# Patient Record
Sex: Female | Born: 1960
Health system: Southern US, Community
[De-identification: ages and names within clinical notes are randomized; demographics above are authoritative.]

## PROBLEM LIST (undated history)

## (undated) DIAGNOSIS — F419 Anxiety disorder, unspecified: Secondary | ICD-10-CM

## (undated) DIAGNOSIS — E785 Hyperlipidemia, unspecified: Secondary | ICD-10-CM

## (undated) DIAGNOSIS — J45909 Unspecified asthma, uncomplicated: Secondary | ICD-10-CM

## (undated) DIAGNOSIS — C801 Malignant (primary) neoplasm, unspecified: Secondary | ICD-10-CM

## (undated) DIAGNOSIS — Z923 Personal history of irradiation: Secondary | ICD-10-CM

## (undated) DIAGNOSIS — C50919 Malignant neoplasm of unspecified site of unspecified female breast: Secondary | ICD-10-CM

## (undated) HISTORY — DX: Malignant neoplasm of unspecified site of unspecified female breast: C50.919

## (undated) HISTORY — PX: BREAST BIOPSY: SHX20

## (undated) HISTORY — PX: BREAST SURGERY: SHX581

## (undated) HISTORY — DX: Hyperlipidemia, unspecified: E78.5

## (undated) HISTORY — PX: HYSTERECTOMY: SHX81

## (undated) HISTORY — DX: Unspecified asthma, uncomplicated: J45.909

## (undated) HISTORY — DX: Anxiety disorder, unspecified: F41.9

---
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
## 2010-06-04 HISTORY — PX: COLONOSCOPY: SHX174

## 2014-06-04 DIAGNOSIS — C801 Malignant (primary) neoplasm, unspecified: Secondary | ICD-10-CM

## 2014-06-04 HISTORY — DX: Malignant (primary) neoplasm, unspecified: C80.1

## 2014-06-04 HISTORY — PX: BREAST LUMPECTOMY: SHX2

## 2016-06-04 HISTORY — PX: ABDOMINAL HYSTERECTOMY: SHX81

## 2019-01-07 ENCOUNTER — Ambulatory Visit (INDEPENDENT_AMBULATORY_CARE_PROVIDER_SITE_OTHER): Payer: BC Managed Care – PPO | Admitting: Family Medicine

## 2019-01-07 ENCOUNTER — Telehealth: Payer: Self-pay | Admitting: Family Medicine

## 2019-01-07 ENCOUNTER — Other Ambulatory Visit: Payer: Self-pay | Admitting: Family Medicine

## 2019-01-07 ENCOUNTER — Encounter: Payer: Self-pay | Admitting: Family Medicine

## 2019-01-07 ENCOUNTER — Other Ambulatory Visit: Payer: Self-pay

## 2019-01-07 ENCOUNTER — Ambulatory Visit: Payer: Self-pay | Admitting: Family Medicine

## 2019-01-07 VITALS — BP 110/82 | HR 74 | Temp 98.6°F | Ht 65.0 in | Wt 205.5 lb

## 2019-01-07 DIAGNOSIS — Z853 Personal history of malignant neoplasm of breast: Secondary | ICD-10-CM

## 2019-01-07 DIAGNOSIS — E785 Hyperlipidemia, unspecified: Secondary | ICD-10-CM

## 2019-01-07 DIAGNOSIS — F418 Other specified anxiety disorders: Secondary | ICD-10-CM | POA: Diagnosis not present

## 2019-01-07 DIAGNOSIS — Z0000 Encounter for general adult medical examination without abnormal findings: Secondary | ICD-10-CM | POA: Diagnosis not present

## 2019-01-07 DIAGNOSIS — Z1283 Encounter for screening for malignant neoplasm of skin: Secondary | ICD-10-CM

## 2019-01-07 DIAGNOSIS — E669 Obesity, unspecified: Secondary | ICD-10-CM

## 2019-01-07 LAB — CBC
HCT: 40.1 % (ref 36.0–46.0)
Hemoglobin: 13.3 g/dL (ref 12.0–15.0)
MCHC: 33.1 g/dL (ref 30.0–36.0)
MCV: 94.6 fl (ref 78.0–100.0)
Platelets: 254 10*3/uL (ref 150.0–400.0)
RBC: 4.24 Mil/uL (ref 3.87–5.11)
RDW: 13.3 % (ref 11.5–15.5)
WBC: 4.9 10*3/uL (ref 4.0–10.5)

## 2019-01-07 LAB — COMPREHENSIVE METABOLIC PANEL
ALT: 26 U/L (ref 0–35)
AST: 22 U/L (ref 0–37)
Albumin: 4.8 g/dL (ref 3.5–5.2)
Alkaline Phosphatase: 73 U/L (ref 39–117)
BUN: 19 mg/dL (ref 6–23)
CO2: 29 mEq/L (ref 19–32)
Calcium: 9.7 mg/dL (ref 8.4–10.5)
Chloride: 103 mEq/L (ref 96–112)
Creatinine, Ser: 0.72 mg/dL (ref 0.40–1.20)
GFR: 82.82 mL/min (ref >60.00)
Glucose, Bld: 103 mg/dL — ABNORMAL HIGH (ref 70–99)
Potassium: 4.7 mEq/L (ref 3.5–5.1)
Sodium: 140 mEq/L (ref 135–145)
Total Bilirubin: 0.9 mg/dL (ref 0.2–1.2)
Total Protein: 7.4 g/dL (ref 6.0–8.3)

## 2019-01-07 LAB — LIPID PANEL
Cholesterol: 248 mg/dL — ABNORMAL HIGH (ref 0–200)
HDL: 79.3 mg/dL (ref >39.00)
LDL Cholesterol: 132 mg/dL — ABNORMAL HIGH (ref 0–99)
NonHDL: 169.02
Total CHOL/HDL Ratio: 3
Triglycerides: 186 mg/dL — ABNORMAL HIGH (ref 0.0–149.0)
VLDL: 37.2 mg/dL (ref 0.0–40.0)

## 2019-01-07 MED ORDER — TRAZODONE HCL 50 MG PO TABS: 25.0000 mg | ORAL_TABLET | Freq: Every evening | ORAL | 3 refills | Status: DC | PRN

## 2019-01-07 MED ORDER — ATORVASTATIN CALCIUM 40 MG PO TABS: 40.0000 mg | ORAL_TABLET | Freq: Every day | ORAL | 2 refills | Status: DC

## 2019-01-07 MED ORDER — ANASTROZOLE 1 MG PO TABS: 1.0000 mg | ORAL_TABLET | Freq: Every day | ORAL | 0 refills | Status: DC

## 2019-01-07 MED ORDER — BUPROPION HCL ER (SMOKING DET) 150 MG PO TB12: 150.0000 mg | ORAL_TABLET | Freq: Two times a day (BID) | ORAL | 2 refills | Status: DC

## 2019-01-07 MED ORDER — CLONAZEPAM 1 MG PO TABS: 0.5000 mg | ORAL_TABLET | ORAL | 2 refills | Status: DC | PRN

## 2019-01-07 NOTE — Telephone Encounter (Signed)
Referral done

## 2019-01-07 NOTE — Progress Notes (Signed)
Chief Complaint  Patient presents with  . New Patient (Initial Visit)       New Patient Visit SUBJECTIVE: HPI: Veronica Lloyd is an 58 y.o.female who is being seen for establishing care.  The patient was previously seen in [place].  Anxiety Patient has a history of anxiety.  She is currently on bupropion 150 mg twice daily.  She is also on Klonopin as needed in addition to [place] at night as needed.  She does not follow with a counselor or psychologist.  She has less responsibility as a Copy.  This seeds her anxiety.  She is not having any adverse reactions with the above.  Patient has a history of breast cancer.  She saw an oncologist in [place].  She is currently on anastrozole.  She needs a refill until she can get in with a specialist.  She has a history of hyperlipidemia for which he takes atorvastatin 40 mg daily.  Diet could be better, she tries to stay active with exercise.  She is not havingany side effects of the medicine.  No known history of coronary artery disease.  No Known Allergies  Past Medical History:  Diagnosis Date  . Anxiety   . Cancer ([place]) 2016   Breast  . Hyperlipidemia    Past Surgical History:  Procedure Laterality Date  . ABDOMINAL HYSTERECTOMY  2018   complete  . BREAST LUMPECTOMY Right 2016   Family History  Problem Relation Age of Onset  . Asthma Mother   . Diabetes Father        type 2  . Heart disease Father   . Cancer Brother        prostate   No Known Allergies  Current Outpatient Medications:  .  anastrozole (ARIMIDEX) 1 MG tablet, Take 1 tablet (1 mg total) by mouth daily., Disp: 30 tablet, Rfl: 0 .  atorvastatin (LIPITOR) 40 MG tablet, Take 1 tablet (40 mg total) by mouth daily., Disp: 90 tablet, Rfl: 2 .  buPROPion (ZYBAN) 150 MG 12 hr tablet, Take 1 tablet (150 mg total) by mouth 2 (two) times daily., Disp: 180 tablet, Rfl: 2 .  clonazePAM (KLONOPIN) 1 MG tablet, Take 0.5-1 tablets (0.5-1 mg total) by mouth as  needed for anxiety., Disp: 30 tablet, Rfl: 2 .  traZODone (DESYREL) 50 MG tablet, Take 0.5-1 tablets (25-50 mg total) by mouth at bedtime as needed for sleep., Disp: 30 tablet, Rfl: 3  ROS Cardiovascular: Denies chest pain  Respiratory: Denies dyspnea   OBJECTIVE: BP 110/82 (BP Location: Left Arm, Patient Position: Sitting, Cuff Size: Large)   Pulse 74   Temp 98.6 F (37 C) (Oral)   Ht 5\' 5"  (1.651 m)   Wt 205 lb 8 oz (93.2 kg)   SpO2 98%   BMI 34.20 kg/m   Constitutional: -  VS reviewed -  Well developed, well nourished, appears stated age -  No apparent distress  Psychiatric: -  Oriented to person, place, and time -  Memory intact -  Affect and mood normal -  Fluent conversation, good eye contact -  Judgment and insight age appropriate  Eye: -  Conjunctivae clear, no discharge -  Pupils symmetric, round, reactive to light  ENMT: -  MMM    Pharynx moist, no exudate, no erythema  Cardiovascular: -  RRR -  No LE edema -  No bruits  Respiratory: -  Normal respiratory effort, no accessory muscle use, no retraction -  Breath sounds equal, no wheezes,  no ronchi, no crackles  Musculoskeletal: -  No clubbing, no cyanosis -  Gait normal  Skin: -  No significant lesion on inspection -  Warm and dry to palpation   ASSESSMENT/PLAN: History of breast cancer - Plan: Ambulatory referral to Oncology, refill Arimidex in meanwhile  Situational anxiety - cont buspirone, klonopin prn, change Lunesta to trazodone, [facility] info given  Hyperlipidemia, unspecified hyperlipidemia type - Plan: cont statin  Well adult exam - Plan: CBC, Comprehensive metabolic panel, Lipid panel  Patient instructed to sign release of records form from her previous PCP.  Patient should return in 1 mo for CPE. The patient voiced understanding and agreement to the plan.   [place], DO 01/07/19  11:51 AM

## 2019-01-07 NOTE — Patient Instructions (Signed)
Please consider counseling. Contact [phone] to schedule an appointment or inquire about cost/insurance coverage.  Coping skills Choose 5 that work for you:  Take a deep breath  Count to 20  Read a book  Do a puzzle  Meditate  Bake  Sing  Knit  Garden  Pray  Go outside  Call a friend  Listen to music  Take a walk  Color  Send a note  Take a bath  Watch a movie  Be alone in a quiet place  Pet an animal  Visit a friend  Journal  Exercise  Stretch   If you do not hear anything about your referral in the next 1-2 weeks, call our office and ask for an update.  Let us know if you need anything.

## 2019-01-07 NOTE — Progress Notes (Signed)
abu

## 2019-01-07 NOTE — Telephone Encounter (Signed)
OK to refer, skin cancer screening is dx. TY.

## 2019-01-09 ENCOUNTER — Telehealth: Payer: Self-pay | Admitting: Hematology

## 2019-01-09 NOTE — Telephone Encounter (Signed)
lmom to inform patient of new patient appt 8/20 at 0830. Advised patient to request previous medical records form [place] and fax to our office [phone] attn Roselyn Reef

## 2019-01-22 ENCOUNTER — Other Ambulatory Visit: Payer: Self-pay | Admitting: Hematology

## 2019-01-22 ENCOUNTER — Inpatient Hospital Stay: Payer: BC Managed Care – PPO | Attending: Hematology | Admitting: Hematology

## 2019-01-22 ENCOUNTER — Encounter: Payer: Self-pay | Admitting: Hematology

## 2019-01-22 ENCOUNTER — Inpatient Hospital Stay: Payer: BC Managed Care – PPO

## 2019-01-22 ENCOUNTER — Other Ambulatory Visit: Payer: Self-pay

## 2019-01-22 VITALS — BP 118/79 | HR 74 | Temp 97.6°F | Resp 19 | Ht 65.0 in | Wt 211.0 lb

## 2019-01-22 DIAGNOSIS — E785 Hyperlipidemia, unspecified: Secondary | ICD-10-CM | POA: Diagnosis not present

## 2019-01-22 DIAGNOSIS — Z90722 Acquired absence of ovaries, bilateral: Secondary | ICD-10-CM | POA: Diagnosis not present

## 2019-01-22 DIAGNOSIS — C50411 Malignant neoplasm of upper-outer quadrant of right female breast: Secondary | ICD-10-CM | POA: Insufficient documentation

## 2019-01-22 DIAGNOSIS — M25571 Pain in right ankle and joints of right foot: Secondary | ICD-10-CM | POA: Diagnosis not present

## 2019-01-22 DIAGNOSIS — M25572 Pain in left ankle and joints of left foot: Secondary | ICD-10-CM | POA: Diagnosis not present

## 2019-01-22 DIAGNOSIS — C50919 Malignant neoplasm of unspecified site of unspecified female breast: Secondary | ICD-10-CM

## 2019-01-22 DIAGNOSIS — Z923 Personal history of irradiation: Secondary | ICD-10-CM | POA: Diagnosis not present

## 2019-01-22 DIAGNOSIS — Z170 Estrogen receptor positive status [ER+]: Secondary | ICD-10-CM | POA: Diagnosis not present

## 2019-01-22 DIAGNOSIS — Z90710 Acquired absence of both cervix and uterus: Secondary | ICD-10-CM | POA: Diagnosis not present

## 2019-01-22 DIAGNOSIS — F419 Anxiety disorder, unspecified: Secondary | ICD-10-CM | POA: Diagnosis not present

## 2019-01-22 DIAGNOSIS — Z79811 Long term (current) use of aromatase inhibitors: Secondary | ICD-10-CM | POA: Diagnosis not present

## 2019-01-22 DIAGNOSIS — Z79899 Other long term (current) drug therapy: Secondary | ICD-10-CM | POA: Diagnosis not present

## 2019-01-22 LAB — CBC WITH DIFFERENTIAL (CANCER CENTER ONLY)
Abs Immature Granulocytes: 0.01 10*3/uL (ref 0.00–0.07)
Basophils Absolute: 0 10*3/uL (ref 0.0–0.1)
Basophils Relative: 1 %
Eosinophils Absolute: 0.2 10*3/uL (ref 0.0–0.5)
Eosinophils Relative: 3 %
HCT: 40.1 % (ref 36.0–46.0)
Hemoglobin: 12.9 g/dL (ref 12.0–15.0)
Immature Granulocytes: 0 %
Lymphocytes Relative: 28 %
Lymphs Abs: 1.5 10*3/uL (ref 0.7–4.0)
MCH: 30.8 pg (ref 26.0–34.0)
MCHC: 32.2 g/dL (ref 30.0–36.0)
MCV: 95.7 fL (ref 80.0–100.0)
Monocytes Absolute: 0.6 10*3/uL (ref 0.1–1.0)
Monocytes Relative: 12 %
Neutro Abs: 3 10*3/uL (ref 1.7–7.7)
Neutrophils Relative %: 56 %
Platelet Count: 247 10*3/uL (ref 150–400)
RBC: 4.19 MIL/uL (ref 3.87–5.11)
RDW: 13.1 % (ref 11.5–15.5)
WBC Count: 5.3 10*3/uL (ref 4.0–10.5)
nRBC: 0 % (ref 0.0–0.2)

## 2019-01-22 LAB — CMP (CANCER CENTER ONLY)
ALT: 26 U/L (ref 0–44)
AST: 20 U/L (ref 15–41)
Albumin: 4.3 g/dL (ref 3.5–5.0)
Alkaline Phosphatase: 66 U/L (ref 38–126)
Anion gap: 6 (ref 5–15)
BUN: 12 mg/dL (ref 6–20)
CO2: 28 mmol/L (ref 22–32)
Calcium: 9.1 mg/dL (ref 8.9–10.3)
Chloride: 104 mmol/L (ref 98–111)
Creatinine: 0.8 mg/dL (ref 0.44–1.00)
GFR, Est AFR Am: >60 mL/min (ref >60)
GFR, Estimated: >60 mL/min (ref >60)
Glucose, Bld: 94 mg/dL (ref 70–99)
Potassium: 4.3 mmol/L (ref 3.5–5.1)
Sodium: 138 mmol/L (ref 135–145)
Total Bilirubin: 0.6 mg/dL (ref 0.3–1.2)
Total Protein: 6.5 g/dL (ref 6.5–8.1)

## 2019-01-22 MED ORDER — ANASTROZOLE 1 MG PO TABS: 1.0000 mg | ORAL_TABLET | Freq: Every day | ORAL | 3 refills | Status: AC

## 2019-01-22 NOTE — Progress Notes (Signed)
[place] NOTE  Patient Care Team: [name], DO as PCP - General (Family Medicine)  HEME/ONC OVERVIEW: 1. Stage I (pT1bpN0Mx) ILC of the right breast, ER/PR+, HER2- -03/2015: lumpectomy w/ SLN bx; path grade 1 ILC, 38m tumor, ER 86%, PR 48%, HER2- (2+ IHC, FISH non-amplified), margins negative, 0/1 SLN +  Adjuvant RT completed in 05/2015  Tamoxifene 06/2015 - 08/2016  TAH/BSO in 07/2016 -08/2016 - present: Arimidex   2. Post-menopause -TAH/BSO in 07/2016 -10/2016: bone density normal on DEXA   TREATMENT REGIMEN:  03/15/2015: lumpectomy w/ SLN bx   04/2015 - 06/03/2015: adjuvant RT   06/2015 - 08/2016: tamoxifene   08/2016 - present: Arimidex   ASSESSMENT & PLAN:   Stage I (pT1bpN0Mx) ILC of the right breast, ER/PR+, HER2- -I reviewed the patient records in detail, including external oncology clinic notes, lab studies, imaging results and pathology reports -In summary, patient was found with an abnormal mammogram in 01/2015, and ultrasound showed a 7 mm mass in the UOQ of the right breast.  Biopsy showe grade 1 ILC, ER 86%, PR 38%,  HER2- (2+ IHC, FISH non-amplified).  Bilateral breast MRI showed no additional findings.  She underwent lumpectomy on 03/15/2015, which showed grade 1 ILC, 765mtumor, ER 86%, PR 48%, HER2- (2+ IHC, FISH non-amplified), margins negative, 0/1 SLN +.  She received adjuvant radiation, followed by tamoxifen for approximately 1 year.  She was transitioned to Arimidex in 08/2016 after undergoing TAH/BSO. -I reviewed the records in detail with the patient -I also discussed with patient in detail the NCCN guideline  -In light of the early stage breast cancer, she will need q6m27monthlinical exam for 5 years, and then annually; in addition, she will need yearly surveillance MMG (next due in 11/2019) -We also discussed the duration of adjuvant AI. ASCO currently recommend a maximum of 5 years of limiting his inhibitor  therapy for postmenopausal women.  In a phase III study with letrozole, treatment with additional 5 years of AI (for total of 10 years of therapy) demonstrated significantly improved grade of disease-free survival and the decreased risk of disease recurrence and contralateral breast cancer, but overall survival was not significantly different, and bone-related adverse events occurred more frequently with letrozole versus placebo. -Patient expressed understanding and will think about it some more -I have refilled her anastrozole x 1 year today  -I also encouraged patient to take calcium-vitamin D supplement for bone health  Orders Placed This Encounter  Procedures  . MM Digital Diagnostic Bilat    Standing Status:   Future    Standing Expiration Date:   01/22/2020    Order Specific Question:   Reason for Exam (SYMPTOM  OR DIAGNOSIS REQUIRED)    Answer:   Hx of Stage I ILC of the right breast    Order Specific Question:   Is the patient pregnant?    Answer:   No    Order Specific Question:   [facility]    [facility] minutes were spent face-to-face with the patient during this encounter and over half of that time was spent on counseling and coordination of care as outlined above.    All questions were answered. The patient knows to call the clinic with any problems, questions or concerns.  Return in 6 months for clinic appt only. Labs every other visit.   YanTish MenD 01/22/2019 9:50 AM   CHIEF  COMPLAINTS/PURPOSE OF CONSULTATION:  "I am doing fine"  HISTORY OF PRESENTING ILLNESS:  Ceirra Belli 58 y.o. female is here because of history of Stage I ILC of the right breast.  Patient was found with an abnormal mammogram in 01/2015, and ultrasound showed a 7 mm mass in the UOQ of the right breast.  Biopsy showe grade 1 ILC, ER 86%, PR 38%,  HER2- (2+ IHC, FISH non-amplified).  Bilateral breast MRI showed no additional findings.  She  underwent lumpectomy on 03/15/2015, which showed grade 1 ILC, 32m tumor, ER 86%, PR 48%, HER2- (2+ IHC, FISH non-amplified), margins negative, 0/1 SLN +.  She received adjuvant radiation, followed by tamoxifen for approximately 1 year.  She was transitioned to Arimidex in 08/2016 after undergoing TAH/BSO.  Patient reports that she is tolerating anastrozole well without any significant side effects, such as arthralgia or myalgia.  She has occasional pain in her ankles, which she attributes to her obesity.  She has been trying to lose some weight.  [place]  She feels well today, and denies any complaint.  [facility]  I have reviewed her chart and materials related to her cancer extensively and collaborated history with the patient. Summary of oncologic history is as follows: Oncology History  Breast cancer ([facility]  03/15/2015 Cancer Staging   Staging form: Breast, AJCC 8th Edition - Pathologic stage from 03/15/2015: Stage IA (pT1b, pN0, cM0, G1, ER+, PR+, HER2-) - Signed by ZTish Men MD on 01/22/2019   01/22/2019 Initial Diagnosis   Breast cancer ([facility]     MEDICAL HISTORY:  Past Medical History:  Diagnosis Date  . Anxiety   . Cancer ([place] 2016   Breast  . Hyperlipidemia     SURGICAL HISTORY: Past Surgical History:  Procedure Laterality Date  . ABDOMINAL HYSTERECTOMY  2018   complete  . BREAST LUMPECTOMY Right 2016    SOCIAL HISTORY: Social History   Socioeconomic History  . Marital status: Married    Spouse name: Not on file  . Number of children: Not on file  . Years of education: Not on file  . Highest education level: Not on file  Occupational History  . Not on file  Social Needs  . Financial resource strain: Not on file  . Food insecurity    Worry: Not on file    Inability: Not on file  . Transportation needs     Medical: Not on file    Non-medical: Not on file  Tobacco Use  . Smoking status: Never Smoker  . Smokeless tobacco: Never Used  Substance and Sexual Activity  . Alcohol use: Yes    Frequency: Never    Comment: rarely  . Drug use: Never  . Sexual activity: Not on file  Lifestyle  . Physical activity    Days per week: Not on file    Minutes per session: Not on file  . Stress: Not on file  Relationships  . Social [place]: Not on file    Gets together: Not on file    Attends religious service: Not on file    Active member of club or organization: Not on file    Attends meetings of clubs or organizations: Not on file    Relationship status: Not on file  . Intimate partner violence    Fear of current  or ex partner: Not on file    Emotionally abused: Not on file    Physically abused: Not on file    Forced sexual activity: Not on file  Other Topics Concern  . Not on file  Social History Narrative  . Not on file    FAMILY HISTORY: Family History  Problem Relation Age of Onset  . Asthma Mother   . Diabetes Father        type 2  . Heart disease Father   . Cancer Brother        prostate    ALLERGIES:  has No Known Allergies.  MEDICATIONS:  Current Outpatient Medications  Medication Sig Dispense Refill  . anastrozole (ARIMIDEX) 1 MG tablet Take 1 tablet (1 mg total) by mouth daily. 90 tablet 3  . atorvastatin (LIPITOR) 40 MG tablet Take 1 tablet (40 mg total) by mouth daily. 90 tablet 2  . buPROPion (ZYBAN) 150 MG 12 hr tablet Take 1 tablet (150 mg total) by mouth 2 (two) times daily. 180 tablet 2  . clonazePAM (KLONOPIN) 1 MG tablet Take 0.5-1 tablets (0.5-1 mg total) by mouth as needed for anxiety. 30 tablet 2  . traZODone (DESYREL) 50 MG tablet Take 0.5-1 tablets (25-50 mg total) by mouth at bedtime as needed for sleep. 30 tablet 3   No current facility-administered medications for this visit.     REVIEW OF SYSTEMS:   Constitutional: ( - )  fevers, ( - )  chills , ( - ) night sweats Eyes: ( - ) blurriness of vision, ( - ) double vision, ( - ) watery eyes Ears, nose, mouth, throat, and face: ( - ) mucositis, ( - ) sore throat Respiratory: ( - ) cough, ( - ) dyspnea, ( - ) wheezes Cardiovascular: ( - ) palpitation, ( - ) chest discomfort, ( - ) lower extremity swelling Gastrointestinal:  ( - ) nausea, ( - ) heartburn, ( - ) change in bowel habits Skin: ( - ) abnormal skin rashes Lymphatics: ( - ) new lymphadenopathy, ( - ) easy bruising Neurological: ( - ) numbness, ( - ) tingling, ( - ) new weaknesses Behavioral/Psych: ( - ) mood change, ( - ) new changes  All other systems were reviewed with the patient and are negative.  PHYSICAL EXAMINATION: ECOG PERFORMANCE STATUS: 0 - Asymptomatic  Vitals:   01/22/19 0918  BP: 118/79  Pulse: 74  Resp: 19  Temp: 97.6 F (36.4 C)  SpO2: 100%   Filed Weights   01/22/19 0918  Weight: 211 lb 0.6 oz (95.7 kg)    GENERAL: alert, no distress and comfortable SKIN: skin color, texture, turgor are normal, no rashes or significant lesions EYES: conjunctiva are pink and non-injected, sclera clear OROPHARYNX: no exudate, no erythema; lips, buccal mucosa, and tongue normal  NECK: supple, non-tender LYMPH:  no palpable lymphadenopathy in the cervical or axillary LUNGS: clear to auscultation with normal breathing effort HEART: regular rate & rhythm, no murmurs, no lower extremity edema ABDOMEN: soft, non-tender, non-distended, normal bowel sounds Musculoskeletal: no cyanosis of digits and no clubbing  PSYCH: alert & oriented x 3, fluent speech NEURO: no focal motor/sensory deficits BREAST: No palpable breast lesions in either breast  LABORATORY DATA:  I have reviewed the data as listed Lab Results  Component Value Date   WBC 5.3 01/22/2019   HGB 12.9 01/22/2019   HCT 40.1 01/22/2019   MCV 95.7 01/22/2019   PLT 247 01/22/2019   Lab Results  Component Value Date   NA 138  01/22/2019   K 4.3 01/22/2019   CL 104 01/22/2019   CO2 28 01/22/2019    RADIOGRAPHIC STUDIES: I have personally reviewed the radiological images as listed and agreed with the findings in the report. No results found.  PATHOLOGY: I have reviewed the pathology reports as documented in the oncologist history.

## 2019-01-23 ENCOUNTER — Other Ambulatory Visit: Payer: Self-pay

## 2019-01-30 ENCOUNTER — Other Ambulatory Visit: Payer: Self-pay | Admitting: Family Medicine

## 2019-01-30 DIAGNOSIS — F418 Other specified anxiety disorders: Secondary | ICD-10-CM

## 2019-01-30 NOTE — Telephone Encounter (Signed)
Last OV---01/07/2019 Last RF---01/07/2019---#30 with 3 refills REQUEST IS FOR 90 days

## 2019-02-10 ENCOUNTER — Encounter: Payer: BC Managed Care – PPO | Admitting: Family Medicine

## 2019-02-19 ENCOUNTER — Ambulatory Visit: Payer: BC Managed Care – PPO | Admitting: Family Medicine

## 2019-02-19 DIAGNOSIS — Z0289 Encounter for other administrative examinations: Secondary | ICD-10-CM

## 2019-02-20 ENCOUNTER — Encounter: Payer: Self-pay | Admitting: Family Medicine

## 2019-02-26 ENCOUNTER — Other Ambulatory Visit: Payer: Self-pay

## 2019-02-27 ENCOUNTER — Ambulatory Visit (INDEPENDENT_AMBULATORY_CARE_PROVIDER_SITE_OTHER): Payer: BC Managed Care – PPO | Admitting: Family Medicine

## 2019-02-27 ENCOUNTER — Encounter: Payer: Self-pay | Admitting: Family Medicine

## 2019-02-27 VITALS — BP 128/84 | HR 79 | Temp 96.8°F | Ht 65.0 in | Wt 213.1 lb

## 2019-02-27 DIAGNOSIS — Z0000 Encounter for general adult medical examination without abnormal findings: Secondary | ICD-10-CM

## 2019-02-27 DIAGNOSIS — Z23 Encounter for immunization: Secondary | ICD-10-CM

## 2019-02-27 DIAGNOSIS — E66812 Obesity, class 2: Secondary | ICD-10-CM | POA: Insufficient documentation

## 2019-02-27 NOTE — Progress Notes (Signed)
Chief Complaint  Patient presents with  . Follow-up     Well Woman Veronica Lloyd is here for a complete physical.   Her last physical was >1 year ago.  Current diet: in general, diet could be better. Current exercise: active at work. Weight is stable and she denies daytime fatigue. No LMP recorded. Seatbelt? Yes  Health Maintenance Mammogram- Yes  She has had a hysterectomy, but still has a cervix.  Does not have a GYN in the area. Colonoscopy- Yes Tetanus- No Hep C screening- Yes HIV screening- Yes  Past Medical History:  Diagnosis Date  . Anxiety   . Cancer ([place]) 2016   Breast  . Hyperlipidemia      Past Surgical History:  Procedure Laterality Date  . ABDOMINAL HYSTERECTOMY  2018   complete  . BREAST LUMPECTOMY Right 2016    Medications  Current Outpatient Medications on File Prior to Visit  Medication Sig Dispense Refill  . anastrozole (ARIMIDEX) 1 MG tablet Take 1 tablet (1 mg total) by mouth daily. 90 tablet 3  . atorvastatin (LIPITOR) 40 MG tablet Take 1 tablet (40 mg total) by mouth daily. 90 tablet 2  . buPROPion (ZYBAN) 150 MG 12 hr tablet Take 1 tablet (150 mg total) by mouth 2 (two) times daily. 180 tablet 2  . clonazePAM (KLONOPIN) 1 MG tablet Take 0.5-1 tablets (0.5-1 mg total) by mouth as needed for anxiety. 30 tablet 2  . traZODone (DESYREL) 50 MG tablet TAKE 0.5-1 TABLETS (25-50 MG TOTAL) BY MOUTH AT BEDTIME AS NEEDED FOR SLEEP. 90 tablet 2   Allergies No Known Allergies  Review of Systems: Constitutional:  no unexpected weight changes Eye:  no recent significant change in vision Ear/Nose/Mouth/Throat:  Ears:  no tinnitus or vertigo and no recent change in hearing Nose/Mouth/Throat:  no complaints of nasal congestion, no sore throat Cardiovascular: no chest pain Respiratory:  no cough and no shortness of breath Gastrointestinal:  no abdominal pain, no change in bowel habits GU:  Female: negative for dysuria or pelvic  pain Musculoskeletal/Extremities: +general stiffness; otherwise no pain of the joints Integumentary (Skin/Breast):  no abnormal skin lesions reported Neurologic:  no headaches Endocrine:  denies weight loss Hematologic/Lymphatic:  No areas of easy bleeding  Exam BP 128/84 (BP Location: Right Arm, Patient Position: Sitting, Cuff Size: Large)   Pulse 79   Temp (!) 96.8 F (36 C) (Temporal)   Ht 5\' 5"  (1.651 m)   Wt 213 lb 2 oz (96.7 kg)   SpO2 99%   BMI 35.47 kg/m  General:  well developed, well nourished, in no apparent distress Skin:  no significant moles, warts, or growths Head:  no masses, lesions, or tenderness Eyes:  pupils equal and round, sclera anicteric without injection Ears:  canals without lesions, TMs shiny without retraction, no obvious effusion, no erythema Nose:  nares patent, septum midline, mucosa normal, and no drainage or sinus tenderness Throat/Pharynx:  lips and gingiva without lesion; tongue and uvula midline; non-inflamed pharynx; no exudates or postnasal drainage Neck: neck supple without adenopathy, thyromegaly, or masses Lungs:  clear to auscultation, breath sounds equal bilaterally, no respiratory distress Cardio:  regular rate and rhythm, no bruits, no LE edema Abdomen:  abdomen soft, nontender; bowel sounds normal; no masses or organomegaly Genital: Defer to GYN Musculoskeletal:  symmetrical muscle groups noted without atrophy or deformity Extremities:  no clubbing, cyanosis, or edema, no deformities, no skin discoloration Neuro:  gait normal; deep tendon reflexes normal and symmetric Psych: well oriented  with normal range of affect and appropriate judgment/insight  Assessment and Plan  Well adult exam - Plan: CBC, Comprehensive metabolic panel, Lipid panel  Morbid obesity ([place]) - Plan: Amb Ref to Medical Weight Management   Well 58 y.o. female. Counseled on diet and exercise. Flu shot in mid October. Other orders as above. Follow up in 6 mo or  prn. The patient voiced understanding and agreement to the plan.  [place], DO 02/27/19 4:34 PM

## 2019-02-27 NOTE — Addendum Note (Signed)
Addended by: Sharon Seller B on: 02/27/2019 04:37 PM   Modules accepted: Orders

## 2019-02-27 NOTE — Patient Instructions (Addendum)
Give us 2-3 business days to get the results of your labs back.  ° °Keep the diet clean and stay active. ° °If you do not hear anything about your referral in the next 1-2 weeks, call our office and ask for an update. ° °Call [facility] [place] at [phone] for an appointment. ° °They are located at [address], [address], [place], [place], [address] (right across the hall from our office). ° °Let us know if you need anything. °

## 2019-02-28 LAB — CBC
HCT: 38.3 % (ref 35.0–45.0)
Hemoglobin: 12.7 g/dL (ref 11.7–15.5)
MCH: 31 pg (ref 27.0–33.0)
MCHC: 33.2 g/dL (ref 32.0–36.0)
MCV: 93.4 fL (ref 80.0–100.0)
MPV: 10.5 fL (ref 7.5–12.5)
Platelets: 260 10*3/uL (ref 140–400)
RBC: 4.1 10*6/uL (ref 3.80–5.10)
RDW: 12.8 % (ref 11.0–15.0)
WBC: 6.3 10*3/uL (ref 3.8–10.8)

## 2019-02-28 LAB — LIPID PANEL
Cholesterol: 227 mg/dL — ABNORMAL HIGH (ref <200)
HDL: 84 mg/dL (ref >=50)
LDL Cholesterol (Calc): 117 mg/dL (calc) — ABNORMAL HIGH
Non-HDL Cholesterol (Calc): 143 mg/dL (calc) — ABNORMAL HIGH (ref <130)
Total CHOL/HDL Ratio: 2.7 (calc) (ref <5.0)
Triglycerides: 144 mg/dL (ref <150)

## 2019-02-28 LAB — COMPREHENSIVE METABOLIC PANEL
AG Ratio: 1.9 (calc) (ref 1.0–2.5)
ALT: 23 U/L (ref 6–29)
AST: 21 U/L (ref 10–35)
Albumin: 4.6 g/dL (ref 3.6–5.1)
Alkaline phosphatase (APISO): 69 U/L (ref 37–153)
BUN: 14 mg/dL (ref 7–25)
CO2: 23 mmol/L (ref 20–32)
Calcium: 9.2 mg/dL (ref 8.6–10.4)
Chloride: 106 mmol/L (ref 98–110)
Creat: 0.82 mg/dL (ref 0.50–1.05)
Globulin: 2.4 g/dL (calc) (ref 1.9–3.7)
Glucose, Bld: 102 mg/dL — ABNORMAL HIGH (ref 65–99)
Potassium: 4.4 mmol/L (ref 3.5–5.3)
Sodium: 141 mmol/L (ref 135–146)
Total Bilirubin: 0.5 mg/dL (ref 0.2–1.2)
Total Protein: 7 g/dL (ref 6.1–8.1)

## 2019-03-30 ENCOUNTER — Other Ambulatory Visit: Payer: Self-pay

## 2019-03-30 ENCOUNTER — Ambulatory Visit
Admission: RE | Admit: 2019-03-30 | Discharge: 2019-03-30 | Disposition: A | Discharge status: Home / Self Care | Payer: BC Managed Care – PPO | Source: Ambulatory Visit | Attending: Hematology | Admitting: Hematology

## 2019-03-30 ENCOUNTER — Ambulatory Visit
Admission: RE | Admit: 2019-03-30 | Discharge: 2019-03-30 | Disposition: A | Discharge status: Home / Self Care | Payer: Self-pay | Source: Ambulatory Visit

## 2019-03-30 DIAGNOSIS — R922 Inconclusive mammogram: Secondary | ICD-10-CM | POA: Diagnosis not present

## 2019-03-30 DIAGNOSIS — Z170 Estrogen receptor positive status [ER+]: Secondary | ICD-10-CM

## 2019-03-30 DIAGNOSIS — C50411 Malignant neoplasm of upper-outer quadrant of right female breast: Secondary | ICD-10-CM

## 2019-03-30 DIAGNOSIS — Z9289 Personal history of other medical treatment: Secondary | ICD-10-CM

## 2019-03-30 HISTORY — DX: Personal history of irradiation: Z92.3

## 2019-03-31 ENCOUNTER — Telehealth: Payer: Self-pay | Admitting: *Deleted

## 2019-03-31 DIAGNOSIS — L814 Other melanin hyperpigmentation: Secondary | ICD-10-CM | POA: Diagnosis not present

## 2019-03-31 DIAGNOSIS — X32XXXS Exposure to sunlight, sequela: Secondary | ICD-10-CM | POA: Diagnosis not present

## 2019-03-31 DIAGNOSIS — D225 Melanocytic nevi of trunk: Secondary | ICD-10-CM | POA: Diagnosis not present

## 2019-03-31 DIAGNOSIS — D229 Melanocytic nevi, unspecified: Secondary | ICD-10-CM | POA: Diagnosis not present

## 2019-03-31 NOTE — Telephone Encounter (Signed)
-----   Message from Tish Men, MD sent at 03/30/2019  8:03 PM EDT ----- Graceann Congress,  Can you let the patient know that her mammogram was normal?  Thanks.  [place]  ----- Message ----- From: Interface, Rad Results In Sent: 03/30/2019  10:56 AM EDT To: Tish Men, MD

## 2019-03-31 NOTE — Telephone Encounter (Signed)
Unable to reach pt. LMOVM, Notified pt mammogram was normal. Encouraged pt to call back with any concerns.

## 2019-04-03 ENCOUNTER — Encounter: Payer: BC Managed Care – PPO | Admitting: Family Medicine

## 2019-05-01 DIAGNOSIS — Z6833 Body mass index (BMI) 33.0-33.9, adult: Secondary | ICD-10-CM | POA: Diagnosis not present

## 2019-05-01 DIAGNOSIS — Z20828 Contact with and (suspected) exposure to other viral communicable diseases: Secondary | ICD-10-CM | POA: Diagnosis not present

## 2019-05-04 ENCOUNTER — Encounter: Payer: Self-pay | Admitting: Family Medicine

## 2019-05-04 ENCOUNTER — Ambulatory Visit (INDEPENDENT_AMBULATORY_CARE_PROVIDER_SITE_OTHER): Payer: BC Managed Care – PPO | Admitting: Family Medicine

## 2019-05-04 VITALS — HR 77 | Temp 98.6°F | Ht 65.0 in | Wt 204.0 lb

## 2019-05-04 DIAGNOSIS — U071 COVID-19: Secondary | ICD-10-CM | POA: Diagnosis not present

## 2019-05-04 NOTE — Progress Notes (Signed)
Chief Complaint  Patient presents with  . covid test results positive    Veronica Lloyd here for URI complaints. Due to COVID-19 pandemic, we are interacting via web portal for an electronic face-to-face visit. I verified patient's ID using 2 identifiers. Patient agreed to proceed with visit via this method. Patient is at home, I am at office. Patient and I are present for visit.   Duration: 5 days  Associated symptoms: myalgia and cough Denies: sinus congestion, sinus pain, rhinorrhea, itchy watery eyes, ear pain, ear drainage, sore throat, wheezing, shortness of breath and fevers Treatment to date: Mucinex, Robitussin Sick contacts: Yes; ppl at work +for COVID She tested positive also  ROS:  Const: Denies fevers HEENT: As noted in HPI Lungs: No SOB  Past Medical History:  Diagnosis Date  . Anxiety   . Cancer ([place]) 2016   Breast  . Hyperlipidemia   . Personal history of radiation therapy     Pulse 77   Temp 98.6 F (37 C) (Oral)   Ht 5\' 5"  (1.651 m)   Wt 204 lb (92.5 kg)   SpO2 95%   BMI 33.95 kg/m  No conversational dyspnea Age appropriate judgment and insight Nml affect and mood  COVID-19  Continue to push fluids, practice good hand hygiene, cover mouth when coughing.  Continue supportive care.  10 days after symptoms started while improving and fever free without antipyretics for 24 hours and she should be good to return to work.  Continue social distancing and mask wearing.  Warning signs/symptoms discussed on when to seek emergent care.  I did send her a MyChart message with some of this information as well. Pt voiced understanding and agreement to the plan.  [place], DO 05/04/19 4:58 PM

## 2019-05-24 ENCOUNTER — Other Ambulatory Visit: Payer: Self-pay | Admitting: Family Medicine

## 2019-05-24 DIAGNOSIS — F418 Other specified anxiety disorders: Secondary | ICD-10-CM

## 2019-05-25 NOTE — Telephone Encounter (Signed)
Requesting:   clonazepam Contract:   none UDS:    none Last Visit:    05/03/2020 Next Visit:     08/28/2019   Last Refill:    01/07/2019    #30 with 2 refills   Please Advise

## 2019-05-27 ENCOUNTER — Encounter: Payer: Self-pay | Admitting: Family Medicine

## 2019-05-27 ENCOUNTER — Ambulatory Visit (INDEPENDENT_AMBULATORY_CARE_PROVIDER_SITE_OTHER): Payer: BC Managed Care – PPO | Admitting: Family Medicine

## 2019-05-27 DIAGNOSIS — U071 COVID-19: Secondary | ICD-10-CM | POA: Diagnosis not present

## 2019-05-27 MED ORDER — BENZONATATE 100 MG PO CAPS: 100.0000 mg | ORAL_CAPSULE | Freq: Three times a day (TID) | ORAL | 0 refills | Status: DC | PRN

## 2019-05-27 MED ORDER — PROMETHAZINE-DM 6.25-15 MG/5ML PO SYRP: 5.0000 mL | ORAL_SOLUTION | Freq: Four times a day (QID) | ORAL | 0 refills | Status: DC | PRN

## 2019-05-27 NOTE — Progress Notes (Signed)
Chief Complaint  Patient presents with  . Cough  . Fatigue    Roni Bread here for Covid f/u. Due to COVID-19 pandemic, we are interacting via web portal for an electronic face-to-face visit. I verified patient's ID using 2 identifiers. Patient agreed to proceed with visit via this method. Patient is at home, I am at office. Patient and I are present for visit.   The patient was diagnosed with Covid around 1 month ago.  Most of her symptoms are improved, but she is still having fatigue and a dry cough.  She is not having shortness of breath or wheezing.  No fevers.  She has been using Robitussin and Mucinex over-the-counter which has not been particularly helpful.  No new sick contacts.  She is wondering if this is normal.  ROS:  Const: Denies fevers HEENT: As noted in HPI Lungs: +cough  Past Medical History:  Diagnosis Date  . Anxiety   . Cancer ([place]) 2016   Breast  . Hyperlipidemia   . Personal history of radiation therapy    Exam No conversational dyspnea Age appropriate judgment and insight Nml affect and mood  COVID-19 - Plan: benzonatate (TESSALON) 100 MG capsule, promethazine-dextromethorphan (PROMETHAZINE-DM) 6.25-15 MG/5ML syrup  I think she is having a post infectious cough and lingering fatigue from virus. Push fluids. Cont supportive care. Listen to body. F/u prn.  Pt voiced understanding and agreement to the plan.  [place], DO 05/27/19 11:57 AM

## 2019-06-20 ENCOUNTER — Other Ambulatory Visit: Payer: Self-pay | Admitting: Family Medicine

## 2019-06-20 DIAGNOSIS — U071 COVID-19: Secondary | ICD-10-CM

## 2019-07-19 ENCOUNTER — Other Ambulatory Visit: Payer: Self-pay | Admitting: Family Medicine

## 2019-07-19 DIAGNOSIS — U071 COVID-19: Secondary | ICD-10-CM

## 2019-07-24 ENCOUNTER — Inpatient Hospital Stay (HOSPITAL_BASED_OUTPATIENT_CLINIC_OR_DEPARTMENT_OTHER): Payer: BC Managed Care – PPO | Admitting: Hematology

## 2019-07-24 ENCOUNTER — Inpatient Hospital Stay: Payer: BC Managed Care – PPO | Attending: Hematology

## 2019-07-24 ENCOUNTER — Other Ambulatory Visit: Payer: Self-pay | Admitting: Hematology

## 2019-07-24 ENCOUNTER — Encounter: Payer: Self-pay | Admitting: Hematology

## 2019-07-24 ENCOUNTER — Other Ambulatory Visit: Payer: Self-pay

## 2019-07-24 VITALS — BP 139/72 | HR 67 | Temp 97.1°F | Resp 18 | Ht 65.0 in | Wt 210.0 lb

## 2019-07-24 DIAGNOSIS — Z170 Estrogen receptor positive status [ER+]: Secondary | ICD-10-CM | POA: Insufficient documentation

## 2019-07-24 DIAGNOSIS — C50411 Malignant neoplasm of upper-outer quadrant of right female breast: Secondary | ICD-10-CM | POA: Diagnosis not present

## 2019-07-24 DIAGNOSIS — R05 Cough: Secondary | ICD-10-CM | POA: Diagnosis not present

## 2019-07-24 DIAGNOSIS — Z79899 Other long term (current) drug therapy: Secondary | ICD-10-CM | POA: Diagnosis not present

## 2019-07-24 DIAGNOSIS — R059 Cough, unspecified: Secondary | ICD-10-CM

## 2019-07-24 DIAGNOSIS — Z79811 Long term (current) use of aromatase inhibitors: Secondary | ICD-10-CM | POA: Diagnosis not present

## 2019-07-24 DIAGNOSIS — Z8616 Personal history of COVID-19: Secondary | ICD-10-CM | POA: Diagnosis not present

## 2019-07-24 LAB — CMP (CANCER CENTER ONLY)
ALT: 24 U/L (ref 0–44)
AST: 20 U/L (ref 15–41)
Albumin: 4.9 g/dL (ref 3.5–5.0)
Alkaline Phosphatase: 60 U/L (ref 38–126)
Anion gap: 7 (ref 5–15)
BUN: 14 mg/dL (ref 6–20)
CO2: 31 mmol/L (ref 22–32)
Calcium: 10 mg/dL (ref 8.9–10.3)
Chloride: 100 mmol/L (ref 98–111)
Creatinine: 0.83 mg/dL (ref 0.44–1.00)
GFR, Est AFR Am: >60 mL/min (ref >60)
GFR, Estimated: >60 mL/min (ref >60)
Glucose, Bld: 107 mg/dL — ABNORMAL HIGH (ref 70–99)
Potassium: 4.2 mmol/L (ref 3.5–5.1)
Sodium: 138 mmol/L (ref 135–145)
Total Bilirubin: 0.8 mg/dL (ref 0.3–1.2)
Total Protein: 7.7 g/dL (ref 6.5–8.1)

## 2019-07-24 LAB — CBC WITH DIFFERENTIAL (CANCER CENTER ONLY)
Abs Immature Granulocytes: 0.01 10*3/uL (ref 0.00–0.07)
Basophils Absolute: 0 10*3/uL (ref 0.0–0.1)
Basophils Relative: 1 %
Eosinophils Absolute: 0.2 10*3/uL (ref 0.0–0.5)
Eosinophils Relative: 3 %
HCT: 39.3 % (ref 36.0–46.0)
Hemoglobin: 13 g/dL (ref 12.0–15.0)
Immature Granulocytes: 0 %
Lymphocytes Relative: 30 %
Lymphs Abs: 1.5 10*3/uL (ref 0.7–4.0)
MCH: 31.3 pg (ref 26.0–34.0)
MCHC: 33.1 g/dL (ref 30.0–36.0)
MCV: 94.7 fL (ref 80.0–100.0)
Monocytes Absolute: 0.7 10*3/uL (ref 0.1–1.0)
Monocytes Relative: 14 %
Neutro Abs: 2.6 10*3/uL (ref 1.7–7.7)
Neutrophils Relative %: 52 %
Platelet Count: 263 10*3/uL (ref 150–400)
RBC: 4.15 MIL/uL (ref 3.87–5.11)
RDW: 12.9 % (ref 11.5–15.5)
WBC Count: 5 10*3/uL (ref 4.0–10.5)
nRBC: 0 % (ref 0.0–0.2)

## 2019-07-24 NOTE — Progress Notes (Signed)
[facility] OFFICE PROGRESS NOTE  Patient Care Team: [name], DO as PCP - General (Family Medicine)  HEME/ONC OVERVIEW: 1. Stage I (pT1bpN0Mx) ILC of the right breast, ER/PR+, HER2- -03/2015: lumpectomy w/ SLN bx  Path: grade 1 ILC, 67m tumor, ER 86%, PR 48%, HER2- (2+ IHC, FISH non-amplified), margins negative, 0/1 SLN +  Adjuvant RT completed in 05/2015  Tamoxifene 06/2015 - 08/2016  TAH/BSO in 07/2016 -08/2016 - present: Arimidex   2. Post-menopause -TAH/BSO in 07/2016 -10/2016: bone density normal on DEXA   TREATMENT REGIMEN:  03/15/2015: lumpectomy w/ SLN bx   04/2015 - 06/03/2015: adjuvant RT   06/2015 - 08/2016: tamoxifene   08/2016 - present: Arimidex   ASSESSMENT & PLAN:   Stage I (pT1bpN0Mx) ILC of the right breast, ER/PR+, HER2- -Currently on Arimidex since 08/2016 -MMG in 03/2019 unremarkable -Given the low-grade, very small ILC in 2016, the likelihood of disease recurrence is very small, and therefore 5 years of hormonal blockade should be sufficient.  While there is an improved disease-free survival and decreased risk of disease recurrence with 10 years of AI, the benefit appears to be predominantly in patients with larger tumors or LN+ disease.  There was no difference in overall survival, and the bone-related adverse events were significantly higher. -Therefore, after lengthy discussion, we agreed for a total of 5 years of anti-estrogen therapy (ie until 06/2020) -Next MMG due in 03/2020  -I also encouraged patient to take calcium-vitamin D supplement for bone health  Cough -Ddx includes recent Covid infection, post-nasal drip, and acid reflux -Patient was diagnosed with Covid in 04/2019, but feels that her cough is different from when she had infection -Since starting Allegra, her cough is improved -I suspect that her cough may be due to postnasal drip, and recommended a trial of OTC Flonase -She also has an appointment  with allergy in the next month -I encouraged patient to continue follow-up with her PCP and allergy for further management  Orders Placed This Encounter  Procedures  . CBC with Differential (Cancer Center Only)    Standing Status:   Future    Standing Expiration Date:   08/27/2020  . [place]    Standing Status:   Future    Standing Expiration Date:   08/27/2020   The total time spent in the encounter was 32 minutes, including face-to-face time with the patient, review of various tests results, order additional studies/medications, documentation, and coordination of care plan.   All questions were answered. The patient knows to call the clinic with any problems, questions or concerns. No barriers to learning was detected.  Return in 6 months for labs and clinic follow-up.   YTish Men MD 2/19/202110:37 AM  CHIEF COMPLAINT: "I still has a cough"  INTERVAL HISTORY: Ms. LWeryreturns clinic for follow-up of history of Stage I ILC of the right breast on Arimidex.  Patient reports that she has been tolerating Arimidex well without any significant side effects, such as arthralgia or myalgia.  She was diagnosed with Covid in 04/2019, and since then, she has had a persistent dry cough despite Tessalon Perles and Robitussin.  She tried over-the-counter Allegra with improvement, and thought she may have seasonal allergy.  She has an appointment with allergy later this month.  She denies any other complaint today.  REVIEW OF SYSTEMS:   Constitutional: ( - ) fevers, ( - )  chills , ( - ) night sweats Eyes: ( - ) blurriness  of vision, ( - ) double vision, ( - ) watery eyes Ears, nose, mouth, throat, and face: ( - ) mucositis, ( - ) sore throat Respiratory: ( + ) cough, ( - ) dyspnea, ( - ) wheezes Cardiovascular: ( - ) palpitation, ( - ) chest discomfort, ( - ) lower extremity swelling Gastrointestinal:  ( - ) nausea, ( - ) heartburn, ( - ) change in bowel habits Skin: ( - )  abnormal skin rashes Lymphatics: ( - ) new lymphadenopathy, ( - ) easy bruising Neurological: ( - ) numbness, ( - ) tingling, ( - ) new weaknesses Behavioral/Psych: ( - ) mood change, ( - ) new changes  All other systems were reviewed with the patient and are negative.  SUMMARY OF ONCOLOGIC HISTORY: Oncology History  Breast cancer ([place])  03/15/2015 Cancer Staging   Staging form: Breast, AJCC 8th Edition - Pathologic stage from 03/15/2015: Stage IA (pT1b, pN0, cM0, G1, ER+, PR+, HER2-) - Signed by Tish Men, MD on 01/22/2019   01/22/2019 Initial Diagnosis   Breast cancer ([place])     I have reviewed the past medical history, past surgical history, social history and family history with the patient and they are unchanged from previous note.  ALLERGIES:  has No Known Allergies.  MEDICATIONS:  Current Outpatient Medications  Medication Sig Dispense Refill  . anastrozole (ARIMIDEX) 1 MG tablet Take 1 mg by mouth daily.    Marland Kitchen atorvastatin (LIPITOR) 40 MG tablet Take 1 tablet (40 mg total) by mouth daily. 90 tablet 2  . benzonatate (TESSALON) 100 MG capsule TAKE 1 CAPSULE BY MOUTH THREE TIMES A DAY AS NEEDED 30 capsule 0  . buPROPion (ZYBAN) 150 MG 12 hr tablet Take 1 tablet (150 mg total) by mouth 2 (two) times daily. 180 tablet 2  . clonazePAM (KLONOPIN) 1 MG tablet TAKE 0.5-1 TABLETS (0.5-1 MG TOTAL) BY MOUTH AS NEEDED FOR ANXIETY. 30 tablet 2  . promethazine-dextromethorphan (PROMETHAZINE-DM) 6.25-15 MG/5ML syrup TAKE 5 MLS BY MOUTH 4 (FOUR) TIMES DAILY AS NEEDED. 118 mL 0  . traZODone (DESYREL) 50 MG tablet TAKE 0.5-1 TABLETS (25-50 MG TOTAL) BY MOUTH AT BEDTIME AS NEEDED FOR SLEEP. 90 tablet 2   No current facility-administered medications for this visit.    PHYSICAL EXAMINATION: ECOG PERFORMANCE STATUS: 0 - Asymptomatic  Today's Vitals   07/24/19 1018  BP: 139/72  Pulse: 67  Resp: 18  Temp: (!) 97.1 F (36.2 C)  TempSrc: Temporal  SpO2: 100%  Weight: 210 lb (95.3 kg)   Height: '5\' 5"'  (1.651 m)  PainSc: 0-No pain   Body mass index is 34.95 kg/m.  Filed Weights   07/24/19 1018  Weight: 210 lb (95.3 kg)    GENERAL: alert, no distress and comfortable SKIN: skin color, texture, turgor are normal, no rashes or significant lesions NECK: supple, non-tender LUNGS: clear to auscultation with normal breathing effort HEART: regular rate & rhythm and no murmurs and no lower extremity edema ABDOMEN: soft, non-tender, non-distended, normal bowel sounds Musculoskeletal: no cyanosis of digits and no clubbing  PSYCH: alert & oriented x 3, fluent speech BREAST: no abnormality nodule palpated, no axillary adenopathy   LABORATORY DATA:  I have reviewed the data as listed    Component Value Date/Time   NA 138 07/24/2019 0941   K 4.2 07/24/2019 0941   CL 100 07/24/2019 0941   CO2 31 07/24/2019 0941   GLUCOSE 107 (H) 07/24/2019 0941   BUN 14 07/24/2019 0941   CREATININE 0.83  07/24/2019 0941   CREATININE 0.82 02/27/2019 1625   CALCIUM 10.0 07/24/2019 0941   PROT 7.7 07/24/2019 0941   ALBUMIN 4.9 07/24/2019 0941   AST 20 07/24/2019 0941   ALT 24 07/24/2019 0941   ALKPHOS 60 07/24/2019 0941   BILITOT 0.8 07/24/2019 0941   GFRNONAA >60 07/24/2019 0941   GFRAA >60 07/24/2019 0941    No results found for: SPEP, UPEP  Lab Results  Component Value Date   WBC 5.0 07/24/2019   NEUTROABS 2.6 07/24/2019   HGB 13.0 07/24/2019   HCT 39.3 07/24/2019   MCV 94.7 07/24/2019   PLT 263 07/24/2019      Chemistry      Component Value Date/Time   NA 138 07/24/2019 0941   K 4.2 07/24/2019 0941   CL 100 07/24/2019 0941   CO2 31 07/24/2019 0941   BUN 14 07/24/2019 0941   CREATININE 0.83 07/24/2019 0941   CREATININE 0.82 02/27/2019 1625      Component Value Date/Time   CALCIUM 10.0 07/24/2019 0941   ALKPHOS 60 07/24/2019 0941   AST 20 07/24/2019 0941   ALT 24 07/24/2019 0941   BILITOT 0.8 07/24/2019 0941       RADIOGRAPHIC STUDIES: I have personally  reviewed the radiological images as listed below and agreed with the findings in the report. No results found.

## 2019-08-06 ENCOUNTER — Other Ambulatory Visit: Payer: Self-pay

## 2019-08-06 ENCOUNTER — Encounter: Payer: Self-pay | Admitting: Allergy and Immunology

## 2019-08-06 ENCOUNTER — Ambulatory Visit (INDEPENDENT_AMBULATORY_CARE_PROVIDER_SITE_OTHER): Payer: BC Managed Care – PPO | Admitting: Allergy and Immunology

## 2019-08-06 VITALS — BP 128/80 | HR 68 | Temp 98.6°F | Resp 16 | Ht 65.6 in | Wt 212.6 lb

## 2019-08-06 DIAGNOSIS — K219 Gastro-esophageal reflux disease without esophagitis: Secondary | ICD-10-CM | POA: Diagnosis not present

## 2019-08-06 DIAGNOSIS — J310 Chronic rhinitis: Secondary | ICD-10-CM | POA: Diagnosis not present

## 2019-08-06 DIAGNOSIS — R062 Wheezing: Secondary | ICD-10-CM | POA: Diagnosis not present

## 2019-08-06 DIAGNOSIS — R05 Cough: Secondary | ICD-10-CM

## 2019-08-06 DIAGNOSIS — R053 Chronic cough: Secondary | ICD-10-CM | POA: Insufficient documentation

## 2019-08-06 DIAGNOSIS — J4530 Mild persistent asthma, uncomplicated: Secondary | ICD-10-CM | POA: Insufficient documentation

## 2019-08-06 MED ORDER — AZELASTINE-FLUTICASONE 137-50 MCG/ACT NA SUSP: NASAL | 5 refills | Status: DC

## 2019-08-06 MED ORDER — HYDROCOD POLST-CPM POLST ER 10-8 MG/5ML PO SUER: 5.0000 mL | Freq: Two times a day (BID) | ORAL | 0 refills | Status: AC

## 2019-08-06 MED ORDER — FLOVENT HFA 110 MCG/ACT IN AERO: 2.0000 | INHALATION_SPRAY | Freq: Two times a day (BID) | RESPIRATORY_TRACT | 5 refills | Status: DC

## 2019-08-06 MED ORDER — AZELASTINE HCL 0.1 % NA SOLN: 2.0000 | Freq: Two times a day (BID) | NASAL | 5 refills | Status: DC

## 2019-08-06 MED ORDER — ALBUTEROL SULFATE HFA 108 (90 BASE) MCG/ACT IN AERS: 1.0000 | INHALATION_SPRAY | RESPIRATORY_TRACT | 2 refills | Status: DC | PRN

## 2019-08-06 MED ORDER — FLUTICASONE PROPIONATE 50 MCG/ACT NA SUSP: 2.0000 | Freq: Every day | NASAL | 5 refills | Status: DC

## 2019-08-06 MED ORDER — OMEPRAZOLE 20 MG PO CPDR: DELAYED_RELEASE_CAPSULE | ORAL | 3 refills | Status: DC

## 2019-08-06 MED ORDER — FLUTTER DEVI: 0 refills | Status: DC

## 2019-08-06 MED ORDER — CARBINOXAMINE MALEATE 6 MG PO TABS: 6.0000 mg | ORAL_TABLET | Freq: Four times a day (QID) | ORAL | 5 refills | Status: DC | PRN

## 2019-08-06 MED ORDER — DEXILANT 60 MG PO CPDR: 60.0000 mg | DELAYED_RELEASE_CAPSULE | Freq: Every day | ORAL | 5 refills | Status: DC

## 2019-08-06 NOTE — Telephone Encounter (Signed)
New prescription for omeprazole 20 mg BID sent to [facility] in place of Dexilant-per Dr. Verlin Fester

## 2019-08-06 NOTE — Assessment & Plan Note (Addendum)
The most common causes of chronic cough include the following: upper airway cough syndrome (UACS) from thick postnasal drainage which is caused by variety of rhinosinus conditions; asthma; and/or gastroesophageal reflux disease (GERD) which may present with or without heartburn. The history and physical examination suggest that her cough is multifactorial with contribution from postnasal drainage, acid reflux, and possible bronchial hyperresponsiveness. We will address these issues at this time.   A prescription has been provided for a flutter valve to be used as needed to break the coughing cycle.  Prednisone has been provided, 40 mg x3 days, 20 mg x1 day, 10 mg x1 day, then stop.  A prescription has been provided for Tussionex ER suspension, 5 mL every 12 hours over the next 3 days, then stop.  Treatment plan as outlined below.    We will regroup in 6 weeks to assess treatment response and adjust therapy accordingly.

## 2019-08-06 NOTE — Patient Instructions (Addendum)
Cough, persistent The most common causes of chronic cough include the following: upper airway cough syndrome (UACS) from thick postnasal drainage which is caused by variety of rhinosinus conditions; asthma; and/or gastroesophageal reflux disease (GERD) which may present with or without heartburn. The history and physical examination suggest that her cough is multifactorial with contribution from postnasal drainage, acid reflux, and possible bronchial hyperresponsiveness. We will address these issues at this time.   A prescription has been provided for a flutter valve to be used as needed to break the coughing cycle.  Prednisone has been provided, 40 mg x3 days, 20 mg x1 day, 10 mg x1 day, then stop.  A prescription has been provided for Tussionex ER suspension, 5 mL every 12 hours over the next 3 days, then stop.  Treatment plan as outlined below.    We will regroup in 6 weeks to assess treatment response and adjust therapy accordingly.  Chronic rhinitis All seasonal and perennial aeroallergen skin tests are negative despite a positive histamine control.  Intranasal steroids, intranasal antihistamines, and first generation antihistamines are effective for symptoms associated with non-allergic rhinitis, whereas second generation antihistamines such as cetirizine (Zyrtec), loratadine (Claritin) and fexofenadine (Allegra) have been found to be ineffective for this condition.  A prescription has been provided for azelastine/fluticasone nasal spray, one spray per nostril 1-2 times daily as needed. Proper nasal spray technique has been discussed and demonstrated.  Nasal saline lavage (NeilMed) has been recommended as needed and prior to medicated nasal sprays along with instructions for proper administration.  A prescription has been provided for RyVent (carbinoxamine maleate) 6mg  every 6-8 hours as needed.  Acid reflux  Appropriate reflux lifestyle modifications have been provided.  A  prescription has been provided for Dexilant 60 mg daily, 30 minutes prior to breakfast.  This dose will be decreased when the reflux/coughing is under better control.  Wheezing The patient's history suggests bronchial hyper-responsiveness.  A prescription has been provided for Flovent (fluticasone) 110 g, 2 inhalations twice a day. To maximize pulmonary deposition, a spacer has been provided along with instructions for its proper administration with an HFA inhaler.  A prescription has been provided for albuterol HFA, 1 to 2 inhalations every 4-6 hours if needed.  Subjective and objective measures of pulmonary function will be followed and the treatment plan will be adjusted accordingly.   Return in about 6 weeks (around 09/17/2019), or if symptoms worsen or fail to improve.   Lifestyle Changes for Controlling GERD  When you have GERD, stomach acid feels as if it's backing up toward your mouth. Whether or not you take medication to control your GERD, your symptoms can often be improved with lifestyle changes.   Raise Your Head  Reflux is more likely to strike when you're lying down flat, because stomach fluid can  flow backward more easily. Raising the head of your bed 4-6 inches can help. To do this:  Slide blocks or books under the legs at the head of your bed. Or, place a wedge under  the mattress. Many foam stores can make a suitable wedge for you. The wedge  should run from your waist to the top of your head.  Don't just prop your head on several pillows. This increases pressure on your  stomach. It can make GERD worse.  Watch Your Eating Habits Certain foods may increase the acid in your stomach or relax the lower esophageal sphincter, making GERD more likely. It's best to avoid the following:  Coffee, tea, and  carbonated drinks (with and without caffeine)  Fatty, fried, or spicy food  Mint, chocolate, onions, and tomatoes  Any other foods that seem to irritate  your stomach or cause you pain  Relieve the Pressure  Eat smaller meals, even if you have to eat more often.  Don't lie down right after you eat. Wait a few hours for your stomach to empty.  Avoid tight belts and tight-fitting clothes.  Lose excess weight.  Tobacco and Alcohol  Avoid smoking tobacco and drinking alcohol. They can make GERD symptoms worse.

## 2019-08-06 NOTE — Assessment & Plan Note (Signed)
   Appropriate reflux lifestyle modifications have been provided.  A prescription has been provided for Dexilant 60 mg daily, 30 minutes prior to breakfast.  This dose will be decreased when the reflux/coughing is under better control.

## 2019-08-06 NOTE — Progress Notes (Signed)
New Patient Note  RE: Veronica Lloyd MRN: [id number] DOB: 08/08/1960 Date of Office Visit: 08/06/2019  Referring provider: No ref. provider found Primary care provider: [name], DO  Chief Complaint: Cough and Nasal Congestion   History of present illness: Veronica Lloyd is a 59 y.o. female presenting today for evaluation of rhinoconjunctivitis and coughing.  She reports that when she moved from [place] to [place] in summer 2020 she developed a mild persistent cough.  In late November 2020 she was diagnosed with Covid-19 and her cough progressed and has persisted since that time.  She reports that the cough is severe, persistent, and "affects everything" including disrupting her sleep and causing her husband to sleep in another bedroom.  She reports that she has coughing fits and on occasion will cough to the point of vomiting.  The cough is dry/nonproductive approximately 85% of the time and productive of clear mucus 15% of the time.  She describes the cough as originating as a tickle in the base of her throat, however she does experience wheezing and dyspnea during and after coughing spells.  She was prescribed benzonatate without perceived benefit.  She recently started taking fexofenadine with mild benefit so she wonders if the cough may be related to environmental allergies.  She experiences nasal congestion, postnasal drainage, ocular pruritus, and lacrimation.  Her nasal and ocular symptoms have increased while living in [place].  She has a family history of asthma and allergies.  She experiences heartburn every other day on average and is currently not on an antacid other than occasionally taking Tums.  Assessment and plan: Cough, persistent The most common causes of chronic cough include the following: upper airway cough syndrome (UACS) from thick postnasal drainage which is caused by variety of rhinosinus conditions; asthma; and/or  gastroesophageal reflux disease (GERD) which may present with or without heartburn. The history and physical examination suggest that her cough is multifactorial with contribution from postnasal drainage, acid reflux, and possible bronchial hyperresponsiveness. We will address these issues at this time.   A prescription has been provided for a flutter valve to be used as needed to break the coughing cycle.  Prednisone has been provided, 40 mg x3 days, 20 mg x1 day, 10 mg x1 day, then stop.  A prescription has been provided for Tussionex ER suspension, 5 mL every 12 hours over the next 3 days, then stop.  Treatment plan as outlined below.    We will regroup in 6 weeks to assess treatment response and adjust therapy accordingly.  Chronic rhinitis All seasonal and perennial aeroallergen skin tests are negative despite a positive histamine control.  Intranasal steroids, intranasal antihistamines, and first generation antihistamines are effective for symptoms associated with non-allergic rhinitis, whereas second generation antihistamines such as cetirizine (Zyrtec), loratadine (Claritin) and fexofenadine (Allegra) have been found to be ineffective for this condition.  A prescription has been provided for azelastine/fluticasone nasal spray, one spray per nostril 1-2 times daily as needed. Proper nasal spray technique has been discussed and demonstrated.  Nasal saline lavage (NeilMed) has been recommended as needed and prior to medicated nasal sprays along with instructions for proper administration.  A prescription has been provided for RyVent (carbinoxamine maleate) 6mg  every 6-8 hours as needed.  Acid reflux  Appropriate reflux lifestyle modifications have been provided.  A prescription has been provided for Dexilant 60 mg daily, 30 minutes prior to breakfast.  This dose will be decreased when the reflux/coughing is under better control.  Wheezing The patient's history suggests bronchial  hyper-responsiveness.  A prescription has been provided for Flovent (fluticasone) 110 g, 2 inhalations twice a day. To maximize pulmonary deposition, a spacer has been provided along with instructions for its proper administration with an HFA inhaler.  A prescription has been provided for albuterol HFA, 1 to 2 inhalations every 4-6 hours if needed.  Subjective and objective measures of pulmonary function will be followed and the treatment plan will be adjusted accordingly.   Meds ordered this encounter  Medications  . Respiratory Therapy Supplies (FLUTTER) DEVI    Sig: Use as directed    Dispense:  1 each    Refill:  0  . chlorpheniramine-HYDROcodone (TUSSIONEX PENNKINETIC ER) 10-8 MG/5ML SUER    Sig: Take 5 mLs by mouth every 12 (twelve) hours for 3 days.    Dispense:  140 mL    Refill:  0  . Azelastine-Fluticasone 137-50 MCG/ACT SUSP    Sig: Use 1 spray per nostril 1-2 times daily as needed    Dispense:  23 g    Refill:  5  . Carbinoxamine Maleate (RYVENT) 6 MG TABS    Sig: Take 6 mg by mouth every 6 (six) hours as needed.    Dispense:  120 tablet    Refill:  5  . dexlansoprazole (DEXILANT) 60 MG capsule    Sig: Take 1 capsule (60 mg total) by mouth daily before breakfast.    Dispense:  30 capsule    Refill:  5  . fluticasone (FLOVENT HFA) 110 MCG/ACT inhaler    Sig: Inhale 2 puffs into the lungs 2 (two) times daily.    Dispense:  1 Inhaler    Refill:  5  . albuterol (PROAIR HFA) 108 (90 Base) MCG/ACT inhaler    Sig: Inhale 1-2 puffs into the lungs every 4 (four) hours as needed for wheezing or shortness of breath.    Dispense:  8 g    Refill:  2    Diagnostics: Spirometry: FVC was 2.80 L (78% predicted) and FEV1 was 2.17 L (78% predicted) with 50 mL postbronchodilator improvement.  Mild restrictive pattern, possibly due to body habitus.  This study was performed while the patient was asymptomatic.  Please see scanned spirometry results for details. Epicutaneous testing:  Negative despite a positive histamine control. Intradermal testing: Negative.  Physical examination: Blood pressure 128/80, pulse 68, temperature 98.6 F (37 C), temperature source Oral, resp. rate 16, height 5' 5.6" (1.666 m), weight 212 lb 9.6 oz (96.4 kg), SpO2 98 %.  General: Alert, interactive, in no acute distress. HEENT: TMs pearly gray, turbinates mildly edematous with thick discharge, post-pharynx mildly erythematous. Neck: Supple without lymphadenopathy. Lungs: Clear to auscultation without wheezing, rhonchi or rales. CV: Normal S1, S2 without murmurs. Abdomen: Nondistended, nontender. Skin: Warm and dry, without lesions or rashes. Extremities:  No clubbing, cyanosis or edema. Neuro:   Grossly intact.  Review of systems:  Review of systems negative except as noted in HPI / PMHx or noted below: Review of Systems  Constitutional: Negative.   HENT: Negative.   Eyes: Negative.   Respiratory: Negative.   Cardiovascular: Negative.   Gastrointestinal: Negative.   Genitourinary: Negative.   Musculoskeletal: Negative.   Skin: Negative.   Neurological: Negative.   Endo/Heme/Allergies: Negative.   Psychiatric/Behavioral: Negative.     Past medical history:  Past Medical History:  Diagnosis Date  . Anxiety   . Asthma   . Cancer ([facility]) 2016   Breast  . Hyperlipidemia   .  Personal history of radiation therapy     Past surgical history:  Past Surgical History:  Procedure Laterality Date  . ABDOMINAL HYSTERECTOMY  2018   complete  . BREAST LUMPECTOMY Right 2016    Family history: Family History  Problem Relation Age of Onset  . Asthma Mother   . Allergic rhinitis Mother   . Diabetes Father        type 2  . Heart disease Father   . Asthma Sister   . Allergic rhinitis Sister   . Cancer Brother        prostate  . Asthma Sister   . Cancer Sister     Social history: Social History   Socioeconomic History  . Marital status: Married    Spouse name: Not on  file  . Number of children: Not on file  . Years of education: Not on file  . Highest education level: Not on file  Occupational History  . Not on file  Tobacco Use  . Smoking status: Never Smoker  . Smokeless tobacco: Never Used  Substance and Sexual Activity  . Alcohol use: Yes    Comment: rarely  . Drug use: Never  . Sexual activity: Not on file  Other Topics Concern  . Not on file  Social History Narrative  . Not on file   Social Determinants of Health   Financial Resource Strain:   . Difficulty of Paying Living Expenses: Not on file  Food Insecurity:   . Worried About Charity fundraiser in the Last Year: Not on file  . Ran Out of Food in the Last Year: Not on file  Transportation Needs:   . Lack of Transportation (Medical): Not on file  . Lack of Transportation (Non-Medical): Not on file  Physical Activity:   . Days of Exercise per Week: Not on file  . Minutes of Exercise per Session: Not on file  Stress:   . Feeling of Stress : Not on file  Social Connections:   . Frequency of Communication with Friends and Family: Not on file  . Frequency of Social Gatherings with Friends and Family: Not on file  . Attends Religious Services: Not on file  . Active Member of Clubs or Organizations: Not on file  . Attends Archivist Meetings: Not on file  . Marital Status: Not on file  Intimate Partner Violence:   . Fear of Current or Ex-Partner: Not on file  . Emotionally Abused: Not on file  . Physically Abused: Not on file  . Sexually Abused: Not on file    Environmental History: Patient lives in a 60 year old house with carpeting throughout, gassy, and central air.  There is no known mold/water damage in the home.  There is a dog in the home which has access to her bedroom.  She is a non-smoker  Current Outpatient Medications  Medication Sig Dispense Refill  . anastrozole (ARIMIDEX) 1 MG tablet Take 1 mg by mouth daily.    Marland Kitchen atorvastatin (LIPITOR) 40 MG tablet  Take 1 tablet (40 mg total) by mouth daily. 90 tablet 2  . buPROPion (ZYBAN) 150 MG 12 hr tablet Take 1 tablet (150 mg total) by mouth 2 (two) times daily. 180 tablet 2  . clonazePAM (KLONOPIN) 1 MG tablet TAKE 0.5-1 TABLETS (0.5-1 MG TOTAL) BY MOUTH AS NEEDED FOR ANXIETY. 30 tablet 2  . fexofenadine (ALLEGRA) 180 MG tablet Take 180 mg by mouth daily.    . traZODone (DESYREL) 50 MG tablet  TAKE 0.5-1 TABLETS (25-50 MG TOTAL) BY MOUTH AT BEDTIME AS NEEDED FOR SLEEP. 90 tablet 2  . albuterol (PROAIR HFA) 108 (90 Base) MCG/ACT inhaler Inhale 1-2 puffs into the lungs every 4 (four) hours as needed for wheezing or shortness of breath. 8 g 2  . azelastine (ASTELIN) 0.1 % nasal spray Place 2 sprays into both nostrils 2 (two) times daily. Use in each nostril as directed 30 mL 5  . Azelastine-Fluticasone 137-50 MCG/ACT SUSP Use 1 spray per nostril 1-2 times daily as needed 23 g 5  . benzonatate (TESSALON) 100 MG capsule TAKE 1 CAPSULE BY MOUTH THREE TIMES A DAY AS NEEDED (Patient not taking: Reported on 08/06/2019) 30 capsule 0  . Carbinoxamine Maleate (RYVENT) 6 MG TABS Take 6 mg by mouth every 6 (six) hours as needed. 120 tablet 5  . chlorpheniramine-HYDROcodone (TUSSIONEX PENNKINETIC ER) 10-8 MG/5ML SUER Take 5 mLs by mouth every 12 (twelve) hours for 3 days. 140 mL 0  . dexlansoprazole (DEXILANT) 60 MG capsule Take 1 capsule (60 mg total) by mouth daily before breakfast. 30 capsule 5  . fluticasone (FLONASE) 50 MCG/ACT nasal spray Place 2 sprays into both nostrils daily. 18.2 mL 5  . fluticasone (FLOVENT HFA) 110 MCG/ACT inhaler Inhale 2 puffs into the lungs 2 (two) times daily. 1 Inhaler 5  . promethazine-dextromethorphan (PROMETHAZINE-DM) 6.25-15 MG/5ML syrup TAKE 5 MLS BY MOUTH 4 (FOUR) TIMES DAILY AS NEEDED. (Patient not taking: Reported on 08/06/2019) 118 mL 0  . Respiratory Therapy Supplies (FLUTTER) DEVI Use as directed 1 each 0   No current facility-administered medications for this visit.    Known  medication allergies: No Known Allergies  I appreciate the opportunity to take part in Kailoni's care. Please do not hesitate to contact me with questions.  Sincerely,   R. Edgar Frisk, MD

## 2019-08-06 NOTE — Assessment & Plan Note (Signed)
All seasonal and perennial aeroallergen skin tests are negative despite a positive histamine control.  Intranasal steroids, intranasal antihistamines, and first generation antihistamines are effective for symptoms associated with non-allergic rhinitis, whereas second generation antihistamines such as cetirizine (Zyrtec), loratadine (Claritin) and fexofenadine (Allegra) have been found to be ineffective for this condition.  A prescription has been provided for azelastine/fluticasone nasal spray, one spray per nostril 1-2 times daily as needed. Proper nasal spray technique has been discussed and demonstrated.  Nasal saline lavage (NeilMed) has been recommended as needed and prior to medicated nasal sprays along with instructions for proper administration.  A prescription has been provided for RyVent (carbinoxamine maleate) 6mg  every 6-8 hours as needed.

## 2019-08-06 NOTE — Assessment & Plan Note (Signed)
The patient's history suggests bronchial hyper-responsiveness.  A prescription has been provided for Flovent (fluticasone) 110 g, 2 inhalations twice a day. To maximize pulmonary deposition, a spacer has been provided along with instructions for its proper administration with an HFA inhaler.  A prescription has been provided for albuterol HFA, 1 to 2 inhalations every 4-6 hours if needed.  Subjective and objective measures of pulmonary function will be followed and the treatment plan will be adjusted accordingly.

## 2019-08-07 ENCOUNTER — Telehealth: Payer: Self-pay | Admitting: *Deleted

## 2019-08-07 NOTE — Telephone Encounter (Signed)
Received fax from [facility] stating the Ryvent was denied. Pt is required to try and fail carbinoxamine 4 mg. Please advise.

## 2019-08-07 NOTE — Telephone Encounter (Signed)
Received PA request for Ryvent 6mg . Submitted through cover my meds.   [facility] is reviewing your PA request and will respond within 24 hours for Medicaid or up to 72 hours for non-Medicaid plans, based on the required timeframe determined by state or federal regulations. To check for an update later, open this request from your dashboard.

## 2019-08-10 MED ORDER — CARBINOXAMINE MALEATE 4 MG PO TABS: ORAL_TABLET | ORAL | 5 refills | Status: DC

## 2019-08-10 NOTE — Telephone Encounter (Signed)
Medication sent.

## 2019-08-10 NOTE — Telephone Encounter (Signed)
Carbinoxamine 4 mg every 6-8 hours as needed. Thanks.

## 2019-08-25 ENCOUNTER — Telehealth: Payer: Self-pay | Admitting: *Deleted

## 2019-08-25 NOTE — Telephone Encounter (Signed)
Patient called and stated that after completing the last round of Prednisone her cough has unfortunately come back and become worse. She just received her flutter valve yesterday and was reviewed how to use it to help break the cough cycle. Patient states that she is taking all medications as prescribed and is wondering if you recommend something else to help with her cough. Please advise.

## 2019-08-25 NOTE — Telephone Encounter (Signed)
Please call and montelukast 10 mg daily at bedtime.  Please inform her of the potential side effect of montelukast (in rare cases, may cause the patient to feel depressed or moody) and have her add guaifenesin 1200 mg (Mucinex Maximum Strength)  twice daily as needed with adequate hydration. If the cough is persisting despite all of these meds prescribed, she should see an ENT for further evaluation. I recommend Dr. Benjamine Mola or Dr. Redmond Baseman. Thanks.

## 2019-08-26 MED ORDER — MONTELUKAST SODIUM 10 MG PO TABS: 10.0000 mg | ORAL_TABLET | Freq: Every day | ORAL | 5 refills | Status: DC

## 2019-08-26 NOTE — Telephone Encounter (Signed)
Called and left a message for patient to call the office in regards to this matter. Rx has been sent to the [facility] on [address].

## 2019-08-26 NOTE — Addendum Note (Signed)
Addended by: Herbie Drape on: 08/26/2019 12:13 PM   Modules accepted: Orders

## 2019-08-27 ENCOUNTER — Other Ambulatory Visit: Payer: Self-pay

## 2019-08-27 NOTE — Telephone Encounter (Signed)
Patient called back and was advised about medications and if there is no relief that a referral will placed to ENT. Patient verbalized understanding.

## 2019-08-27 NOTE — Telephone Encounter (Signed)
Called and left a voicemail asking for patient to return call to discuss.  °

## 2019-08-28 ENCOUNTER — Encounter: Payer: Self-pay | Admitting: Family Medicine

## 2019-08-28 ENCOUNTER — Other Ambulatory Visit: Payer: Self-pay

## 2019-08-28 ENCOUNTER — Ambulatory Visit (INDEPENDENT_AMBULATORY_CARE_PROVIDER_SITE_OTHER): Payer: BC Managed Care – PPO | Admitting: Family Medicine

## 2019-08-28 VITALS — BP 140/88 | HR 86 | Temp 97.7°F | Ht 65.0 in | Wt 208.1 lb

## 2019-08-28 DIAGNOSIS — Z79899 Other long term (current) drug therapy: Secondary | ICD-10-CM

## 2019-08-28 DIAGNOSIS — F411 Generalized anxiety disorder: Secondary | ICD-10-CM

## 2019-08-28 DIAGNOSIS — E785 Hyperlipidemia, unspecified: Secondary | ICD-10-CM

## 2019-08-28 DIAGNOSIS — R05 Cough: Secondary | ICD-10-CM

## 2019-08-28 DIAGNOSIS — R058 Other specified cough: Secondary | ICD-10-CM

## 2019-08-28 LAB — LIPID PANEL
Cholesterol: 234 mg/dL — ABNORMAL HIGH (ref 0–200)
HDL: 85.4 mg/dL (ref >39.00)
LDL Cholesterol: 128 mg/dL — ABNORMAL HIGH (ref 0–99)
NonHDL: 148.79
Total CHOL/HDL Ratio: 3
Triglycerides: 103 mg/dL (ref 0.0–149.0)
VLDL: 20.6 mg/dL (ref 0.0–40.0)

## 2019-08-28 MED ORDER — FLUTICASONE FUROATE-VILANTEROL 100-25 MCG/INH IN AEPB: 1.0000 | INHALATION_SPRAY | Freq: Every day | RESPIRATORY_TRACT | 2 refills | Status: DC

## 2019-08-28 NOTE — Progress Notes (Signed)
Chief Complaint  Patient presents with  . Follow-up    discuss problems with cough/since covid.  Does see Allergist for cough    Subjective: Patient is a 59 y.o. female here for f/u.  Hx of a cough following ovid. Saw an allergist who placed on INCS, SABA, ICS, nasal antihist, cough syrup and pred. The latter 2 helped. No SOB or other lingering URI s/s's.   F/u for anxiety. Takes Klonopin 1x/week lately. Trazodone helpful for sleep. No AE's. No SI or HI. Work serves as Horticulturist, commercial.   Hyperlipidemia Patient presents for dyslipidemia follow up. Currently being treated with Lipitor 40 mg/d and compliance with treatment thus far has been good. She denies myalgias. She is usually adhering to a healthy diet. The patient is not known to have coexisting coronary artery disease.   Past Medical History:  Diagnosis Date  . Anxiety   . Asthma   . Cancer ([facility]) 2016   Breast  . Hyperlipidemia   . Personal history of radiation therapy     Objective: BP 140/88 (BP Location: Left Arm, Patient Position: Sitting, Cuff Size: Normal)   Pulse 86   Temp 97.7 F (36.5 C) (Temporal)   Ht 5\' 5"  (1.651 m)   Wt 208 lb 2 oz (94.4 kg)   SpO2 97%   BMI 34.63 kg/m  General: Awake, appears stated age HEENT: MMM, EOMi Heart: RRR, no murmurs Lungs: CTAB, no rales, wheezes or rhonchi. No accessory muscle use Psych: Age appropriate judgment and insight, normal affect and mood  Assessment and Plan: Post-viral cough syndrome - Plan: fluticasone furoate-vilanterol (BREO ELLIPTA) 100-25 MCG/INH AEPB  GAD (generalized anxiety disorder)  Hyperlipidemia, unspecified hyperlipidemia type - Plan: Lipid panel  Encounter for long-term (current) use of high-risk medication - Plan: Pain Mgmt, Profile 8 w/Conf, U  1- Add LABA. Refer pulm if no improvement in next 2-3 weeks. 2- Cont Klonopin prn, trazodone prn. CSC and UDS today. 3- Cont Lipitor, ck lipids. F/u in 6 mo or prn. The patient voiced  understanding and agreement to the plan.  [place], DO 08/28/19  12:16 PM

## 2019-08-28 NOTE — Progress Notes (Deleted)
Chief Complaint  Patient presents with  . Follow-up    discuss problems with cough/since covid.  Does see Allergist for cough    Subjective Veronica Lloyd presents for f/u anxiety/depression.  She is currently being treated with Bupropion 150 mg bid, Klonopin prn (uses around 1x/week).  Reports doing well since treatment. No thoughts of harming self or others. No self-medication with alcohol, prescription drugs or illicit drugs. Pt is not following with a counselor/psychologist.  Hyperlipidemia Patient presents for dyslipidemia follow up. Currently being treated with *** and compliance with treatment thus far has been good. She {Denies/complains:31533} myalgias. She {ACTION; IS/IS [id number] adhering to a healthy diet. Exercise:  The patient {ACTION; IS/IS [id number] known to have coexisting coronary artery disease.  Past Medical History:  Diagnosis Date  . Anxiety   . Asthma   . Cancer ([facility]) 2016   Breast  . Hyperlipidemia   . Personal history of radiation therapy    Allergies as of 08/28/2019   No Known Allergies     Medication List       Accurate as of August 28, 2019 10:11 AM. If you have any questions, ask your nurse or doctor.        STOP taking these medications   azelastine 0.1 % nasal spray Commonly known as: ASTELIN Stopped by: [name], DO   benzonatate 100 MG capsule Commonly known as: TESSALON Stopped by: [name], DO   Flovent HFA 110 MCG/ACT inhaler Generic drug: fluticasone Stopped by: [name], DO   promethazine-dextromethorphan 6.25-15 MG/5ML syrup Commonly known as: PROMETHAZINE-DM Stopped by: [name], DO     TAKE these medications   albuterol 108 (90 Base) MCG/ACT inhaler Commonly known as: ProAir HFA Inhale 1-2 puffs into the lungs every 4 (four) hours as needed for wheezing or shortness of breath.   anastrozole 1 MG tablet Commonly known as: ARIMIDEX Take 1 mg by  mouth daily.   atorvastatin 40 MG tablet Commonly known as: LIPITOR Take 1 tablet (40 mg total) by mouth daily.   Azelastine-Fluticasone 137-50 MCG/ACT Susp Use 1 spray per nostril 1-2 times daily as needed   buPROPion 150 MG 12 hr tablet Commonly known as: ZYBAN Take 1 tablet (150 mg total) by mouth 2 (two) times daily.   Carbinoxamine Maleate 6 MG Tabs Commonly known as: RyVent Take 6 mg by mouth every 6 (six) hours as needed.   Carbinoxamine Maleate 4 MG Tabs Take 1 tablet every 6-8 hours as needed   clonazePAM 1 MG tablet Commonly known as: KLONOPIN TAKE 0.5-1 TABLETS (0.5-1 MG TOTAL) BY MOUTH AS NEEDED FOR ANXIETY.   Dexilant 60 MG capsule Generic drug: dexlansoprazole Take 1 capsule (60 mg total) by mouth daily before breakfast.   fexofenadine 180 MG tablet Commonly known as: ALLEGRA Take 180 mg by mouth daily.   fluticasone 50 MCG/ACT nasal spray Commonly known as: FLONASE Place 2 sprays into both nostrils daily.   fluticasone furoate-vilanterol 100-25 MCG/INH Aepb Commonly known as: BREO ELLIPTA Inhale 1 puff into the lungs daily. Started by: [name], DO   Flutter Devi Use as directed   montelukast 10 MG tablet Commonly known as: SINGULAIR Take 1 tablet (10 mg total) by mouth at bedtime.   omeprazole 20 MG capsule Commonly known as: PRILOSEC One capsule twice a day 30 minutes prior to meals   traZODone 50 MG tablet Commonly known as: DESYREL TAKE 0.5-1 TABLETS (25-50 MG TOTAL) BY MOUTH AT BEDTIME AS NEEDED FOR  SLEEP.       Exam BP 140/88 (BP Location: Left Arm, Patient Position: Sitting, Cuff Size: Normal)   Pulse 86   Temp 97.7 F (36.5 C) (Temporal)   Ht 5\' 5"  (1.651 m)   Wt 208 lb 2 oz (94.4 kg)   SpO2 97%   BMI 34.63 kg/m  General:  well developed, well nourished, in no apparent distress Neck: neck supple without adenopathy, thyromegaly, or masses Lungs:  clear to auscultation, breath sounds equal bilaterally, no  respiratory distress Cardio:  regular rate and rhythm without murmurs, heart sounds without clicks or rubs Psych: well oriented with normal range of affect and age-appropriate judgement/insight, alert and oriented x4.  Assessment and Plan  Post-viral cough syndrome  Orders as above. F/u in 6 mo or prn. The patient voiced understanding and agreement to the plan.  [place], DO 08/28/19 10:11 AM

## 2019-08-28 NOTE — Patient Instructions (Addendum)
Keep the diet clean and stay active.  Give [place] 2-3 business days to get the results of your labs back.   Send me a message in 2-3 weeks if no better with the cough and we will set you up with a lung specialist.   Let us know if you need anything.

## 2019-08-30 LAB — PAIN MGMT, PROFILE 8 W/CONF, U
6 Acetylmorphine: NEGATIVE ng/mL
Alcohol Metabolites: POSITIVE ng/mL — AB (ref <500)
Amphetamines: NEGATIVE ng/mL
Benzodiazepines: NEGATIVE ng/mL
Buprenorphine, Urine: NEGATIVE ng/mL
Cocaine Metabolite: NEGATIVE ng/mL
Creatinine: 103.9 mg/dL
Ethyl Glucuronide (ETG): 22146 ng/mL
Ethyl Sulfate (ETS): 3678 ng/mL
MDMA: NEGATIVE ng/mL
Marijuana Metabolite: NEGATIVE ng/mL
Opiates: NEGATIVE ng/mL
Oxidant: NEGATIVE ug/mL
Oxycodone: NEGATIVE ng/mL
pH: 7.4 (ref 4.5–9.0)

## 2019-09-21 ENCOUNTER — Other Ambulatory Visit: Payer: Self-pay | Admitting: Family Medicine

## 2019-09-21 ENCOUNTER — Other Ambulatory Visit: Payer: Self-pay

## 2019-09-21 ENCOUNTER — Ambulatory Visit (INDEPENDENT_AMBULATORY_CARE_PROVIDER_SITE_OTHER): Payer: BC Managed Care – PPO | Admitting: Pediatrics

## 2019-09-21 ENCOUNTER — Telehealth: Payer: Self-pay | Admitting: Pediatrics

## 2019-09-21 ENCOUNTER — Encounter: Payer: Self-pay | Admitting: Pediatrics

## 2019-09-21 ENCOUNTER — Ambulatory Visit (HOSPITAL_BASED_OUTPATIENT_CLINIC_OR_DEPARTMENT_OTHER)
Admission: RE | Admit: 2019-09-21 | Discharge: 2019-09-21 | Disposition: A | Discharge status: Home / Self Care | Payer: BC Managed Care – PPO | Source: Ambulatory Visit | Attending: Pediatrics | Admitting: Pediatrics

## 2019-09-21 VITALS — BP 118/76 | HR 64 | Temp 98.5°F | Resp 16

## 2019-09-21 DIAGNOSIS — Z8616 Personal history of COVID-19: Secondary | ICD-10-CM | POA: Diagnosis not present

## 2019-09-21 DIAGNOSIS — J310 Chronic rhinitis: Secondary | ICD-10-CM

## 2019-09-21 DIAGNOSIS — R062 Wheezing: Secondary | ICD-10-CM

## 2019-09-21 DIAGNOSIS — J4540 Moderate persistent asthma, uncomplicated: Secondary | ICD-10-CM

## 2019-09-21 DIAGNOSIS — R059 Cough, unspecified: Secondary | ICD-10-CM

## 2019-09-21 DIAGNOSIS — R053 Chronic cough: Secondary | ICD-10-CM

## 2019-09-21 DIAGNOSIS — F418 Other specified anxiety disorders: Secondary | ICD-10-CM

## 2019-09-21 DIAGNOSIS — R05 Cough: Secondary | ICD-10-CM | POA: Insufficient documentation

## 2019-09-21 DIAGNOSIS — K219 Gastro-esophageal reflux disease without esophagitis: Secondary | ICD-10-CM | POA: Insufficient documentation

## 2019-09-21 MED ORDER — DEXILANT 60 MG PO CPDR: 60.0000 mg | DELAYED_RELEASE_CAPSULE | Freq: Every day | ORAL | 5 refills | Status: DC

## 2019-09-21 MED ORDER — ALBUTEROL SULFATE HFA 108 (90 BASE) MCG/ACT IN AERS: 2.0000 | INHALATION_SPRAY | RESPIRATORY_TRACT | 2 refills | Status: DC | PRN

## 2019-09-21 NOTE — Progress Notes (Signed)
[address] [place] [address] Dept: [phone]  FOLLOW UP NOTE  Patient ID: Veronica Lloyd, female    DOB: 1960/06/15  Age: 59 y.o. MRN: [id number] Date of Office Visit: 09/21/2019  Assessment  Chief Complaint: Cough (still has a lot of cough. coughing at night but not as bad.)  HPI Veronica Lloyd presents for follow up of persistent cough, chronic rhinitis, reflux and wheezing.  She was last seen on August 06, 2019 by Dr. Verlin Fester.  She reports that while taking the prednisone and Tussionex cough syrup her cough got 80% better, but came back after finishing these 2 medications.  She continues to take Singulair 10 mg once a day and Flovent 110 mcg 1 puff twice a day. She could not tell that the spacer  made any difference in her cough.  She also reports that she does not have an albuterol rescue inhaler.  Her primary care physician gave her Breo 100 mcg and she reports that this helped some in the beginning, but it seems like her body gets used to the medication. She is currently out of Breo. She did mention that for the past week that her cough has been 50% better.  Her cough is described now as dry.  She will cough during the day and night but not as worse as what it was.  She now will cough right before going to bed, will fall asleep and will wake up cough once and fall right back to sleep. She  then when she wakes up in the morning and will cough.  She denies any tightness in her chest or shortness of breath. She does report a" little bit "of wheezing.  She denies any fevers or chills.  She reports that she has not had a chest x-ray in over 30 years. Her reflux is reported as well controlled with the use of Dexilant 60 mg once a day.  She reports that this medication has helped with her reflux.  She did mention that the past 2 weeks she has noticed some tightness in her throat when she sits down but is alleviated when she drinks water or gets up and walks around. She reports runny  nose and sneezing but denies any stuffy nose. She reports that she is currently using 3 nose sprays and is unsure of the names of these medications.  Drug Allergies:  No Known Allergies  Physical Exam: BP 118/76 (BP Location: Left Arm, Patient Position: Sitting, Cuff Size: Normal)   Pulse 64   Temp 98.5 F (36.9 C) (Oral)   Resp 16   SpO2 98%    Physical Exam Constitutional:      Appearance: Normal appearance.  HENT:     Head: Normocephalic and atraumatic.     Comments: Pharynx normal. Eyes normal. Nose normal. Ears: bilateral cerumen.    Right Ear: External ear normal.     Left Ear: External ear normal.     Nose: Nose normal.     Mouth/Throat:     Mouth: Mucous membranes are moist.  Eyes:     Conjunctiva/sclera: Conjunctivae normal.  Cardiovascular:     Rate and Rhythm: Normal rate and regular rhythm.     Heart sounds: Normal heart sounds.  Pulmonary:     Effort: Pulmonary effort is normal.     Breath sounds: Normal breath sounds.     Comments: Lungs clear to auscultation. Skin:    General: Skin is warm.  Neurological:     General: No focal  deficit present.     Mental Status: She is alert and oriented to person, place, and time.  Psychiatric:        Mood and Affect: Mood normal.        Behavior: Behavior normal.        Thought Content: Thought content normal.        Judgment: Judgment normal.     Diagnostics: FVC 2.74 L, FEV1 2.37 L.  Predicted FVC 3.42 L, FEV1 2.65 L.  Spirometry indicates normal ventilatory function.  Assessment and Plan: 1. Cough, persistent   2. Cough   3. History of COVID-19   4. Wheezing   5. Gastroesophageal reflux disease, unspecified whether esophagitis present   6. Chronic rhinitis   7. Moderate persistent reactive airway disease without complication     Meds ordered this encounter  Medications  . albuterol (PROAIR HFA) 108 (90 Base) MCG/ACT inhaler    Sig: Inhale 2 puffs into the lungs every 4 (four) hours as needed for  wheezing or shortness of breath.    Dispense:  8 g    Refill:  2    Patient Instructions  Cough Get chest x- ray. We will call you with results Continue Singulair 10 mg- take one tablet once a day to help prevent cough Start Flovent 110 with spacer- take 2 puffs twice a day to help prevent cough. Rinse mouth out after to help prevent thrush May use albuterol 2 puffs every 4 hours as needed for cough, wheeze, tightness in chest or shortness of breath May use Robitussin DM to help with cough  Chronic rhinitis Continue carbinoxamine 4 mg every 6- 8 hours as needed to help with runny nose Stop fluticasone and azelastine nose sprays   Reflux Continue Dexilant 60 mg - take one tablet once a day to help with reflux   Continue all other medications Please let us know if this treatment plan is not working well for you.  Schedule follow up appointment in 1 month   Return in about 4 weeks (around 10/19/2019), or if symptoms worsen or fail to improve.      Thank you for the opportunity to care for this patient.  Please do not hesitate to contact me with questions.  Althea Charon, FNP [facility]   I have provided oversight concerning Enis Gash' evaluation and treatment of this patient's health issues addressed during today's encounter. I agree with the assessment and therapeutic plan as outlined in the note.   Thank you for the opportunity to care for this patient.  Please do not hesitate to contact me with questions.  Penne Lash, M.D.  [facility] [address] [place], [place] [address] [phone]

## 2019-09-21 NOTE — Telephone Encounter (Signed)
Tried calling pt lm for pt to call us back 

## 2019-09-21 NOTE — Telephone Encounter (Signed)
Sent in dexilant, and answered questions for pt about breo stopping

## 2019-09-21 NOTE — Telephone Encounter (Signed)
PT called was seen by dr Shaune Leeks today and has a question about a medication.

## 2019-09-21 NOTE — Patient Instructions (Addendum)
Cough Get chest x- ray. We will call you with results Continue Singulair 10 mg- take one tablet once a day to help prevent cough Start Flovent 110 with spacer- take 2 puffs twice a day to help prevent cough. Rinse mouth out after to help prevent thrush May use albuterol 2 puffs every 4 hours as needed for cough, wheeze, tightness in chest or shortness of breath May use Robitussin DM to help with cough  Chronic rhinitis Continue carbinoxamine 4 mg every 6- 8 hours as needed to help with runny nose Stop fluticasone and azelastine nose sprays   Reflux Continue Dexilant 60 mg - take one tablet once a day to help with reflux   Continue all other medications Please let us know if this treatment plan is not working well for you.  Schedule follow up appointment in 1 month

## 2019-09-22 ENCOUNTER — Other Ambulatory Visit: Payer: Self-pay | Admitting: Family Medicine

## 2019-09-22 ENCOUNTER — Telehealth: Payer: Self-pay | Admitting: *Deleted

## 2019-09-22 DIAGNOSIS — F418 Other specified anxiety disorders: Secondary | ICD-10-CM

## 2019-09-22 NOTE — Telephone Encounter (Signed)
Pt inquiring about her CXR results. Please advise.

## 2019-09-22 NOTE — Telephone Encounter (Signed)
Spoke to pt. cxr results verbally given. Pt. Verbally understood. Pt. Will make an appointment with her pcp to review the calcified aorta findings with her family Doctor, bc of having elevated cholesterol in the past.

## 2019-09-22 NOTE — Telephone Encounter (Signed)
I called the patient and her voice mailbox was full.  I made a note on her results of her x-ray.  If she calls back let her know what I wrote on the chest x-ray result.

## 2019-09-28 ENCOUNTER — Other Ambulatory Visit: Payer: Self-pay

## 2019-09-28 ENCOUNTER — Encounter: Payer: Self-pay | Admitting: Family Medicine

## 2019-09-28 ENCOUNTER — Ambulatory Visit (INDEPENDENT_AMBULATORY_CARE_PROVIDER_SITE_OTHER): Payer: BC Managed Care – PPO | Admitting: Family Medicine

## 2019-09-28 VITALS — BP 132/82 | HR 84 | Temp 95.9°F | Ht 65.0 in | Wt 209.0 lb

## 2019-09-28 DIAGNOSIS — I700 Atherosclerosis of aorta: Secondary | ICD-10-CM

## 2019-09-28 MED ORDER — ATORVASTATIN CALCIUM 80 MG PO TABS: 80.0000 mg | ORAL_TABLET | Freq: Every day | ORAL | 3 refills | Status: DC

## 2019-09-28 NOTE — Progress Notes (Signed)
Chief Complaint  Patient presents with  . Follow-up    Subjective: Patient is a 59 y.o. female here for f/u abn XR.  Pt had XR w allergy team and it showed aortic calcification. She is on Lipitor 40 mg/d. Diet is fair. She tries to stay active. She is intermittent fasting which has helped. No Cp or sob.   Past Medical History:  Diagnosis Date  . Anxiety   . Asthma   . Cancer ([place]) 2016   Breast  . Hyperlipidemia   . Personal history of radiation therapy     Objective: BP 132/82 (BP Location: Left Arm, Patient Position: Sitting, Cuff Size: Normal)   Pulse 84   Temp (!) 95.9 F (35.5 C) (Temporal)   Ht 5\' 5"  (1.651 m)   Wt 209 lb (94.8 kg)   SpO2 97%   BMI 34.78 kg/m  General: Awake, appears stated age Heart: RRR, no murmurs, no bruits, no LE edema Lungs: CTAB, no rales, wheezes or rhonchi. No accessory muscle use Psych: Age appropriate judgment and insight, normal affect and mood  Assessment and Plan: Aortic calcification (HCC) - Plan: atorvastatin (LIPITOR) 80 MG tablet  Reviewed XR w pt. Increase dosage of Lipitor from 40 mg/d to 80 mg/d. Counseled on diet/exercise. F/u as originally scheduled.  The patient voiced understanding and agreement to the plan.  [place], DO 09/28/19  10:21 AM

## 2019-09-28 NOTE — Patient Instructions (Signed)
Stay hydrated.  Take 2 tabs of your Lipitor until you run out. A higher dosage has been called in.   Let us know if you need anything.

## 2019-10-28 ENCOUNTER — Other Ambulatory Visit: Payer: Self-pay | Admitting: Family Medicine

## 2019-10-28 DIAGNOSIS — F418 Other specified anxiety disorders: Secondary | ICD-10-CM

## 2019-10-28 NOTE — Telephone Encounter (Signed)
Last OV---09/28/2019 Next OV---02/29/2020 Last RF---01/30/2019---#90 with 2 refills UDS/CSC---08/28/2019

## 2019-11-02 ENCOUNTER — Other Ambulatory Visit: Payer: Self-pay | Admitting: Family Medicine

## 2019-11-02 DIAGNOSIS — F418 Other specified anxiety disorders: Secondary | ICD-10-CM

## 2019-11-03 NOTE — Telephone Encounter (Signed)
Requesting:  clonazepam Contract:   08/28/2019 UDS:   08/28/2019 Last Visit:   09/28/2019 Next Visit:   02/29/2020 Last Refill:    09/21/2019  #30 with no refills  Please Advise

## 2019-11-10 ENCOUNTER — Encounter: Payer: Self-pay | Admitting: Allergy and Immunology

## 2019-11-10 ENCOUNTER — Other Ambulatory Visit: Payer: Self-pay

## 2019-11-10 ENCOUNTER — Ambulatory Visit (INDEPENDENT_AMBULATORY_CARE_PROVIDER_SITE_OTHER): Payer: BC Managed Care – PPO | Admitting: Allergy and Immunology

## 2019-11-10 VITALS — BP 134/88 | HR 88 | Temp 98.0°F | Resp 16 | Ht 65.0 in | Wt 215.4 lb

## 2019-11-10 DIAGNOSIS — K219 Gastro-esophageal reflux disease without esophagitis: Secondary | ICD-10-CM | POA: Diagnosis not present

## 2019-11-10 DIAGNOSIS — J310 Chronic rhinitis: Secondary | ICD-10-CM | POA: Diagnosis not present

## 2019-11-10 DIAGNOSIS — J4530 Mild persistent asthma, uncomplicated: Secondary | ICD-10-CM | POA: Diagnosis not present

## 2019-11-10 DIAGNOSIS — R05 Cough: Secondary | ICD-10-CM | POA: Diagnosis not present

## 2019-11-10 DIAGNOSIS — R053 Chronic cough: Secondary | ICD-10-CM

## 2019-11-10 MED ORDER — FLOVENT HFA 110 MCG/ACT IN AERO: INHALATION_SPRAY | RESPIRATORY_TRACT | 5 refills | Status: DC

## 2019-11-10 MED ORDER — FAMOTIDINE 20 MG PO TABS: ORAL_TABLET | ORAL | 5 refills | Status: DC

## 2019-11-10 NOTE — Progress Notes (Signed)
Follow-up Note  RE: Veronica Lloyd MRN: [id number] DOB: Oct 11, 1960 Date of Office Visit: 11/10/2019  Primary care provider: [name], DO Referring provider: [name]*  History of present illness: Veronica Lloyd is a 59 y.o. female with a history of persistent cough, wheezing, chronic rhinitis, and acid reflux presenting today for follow-up.  She was last seen in this clinic on September 21, 2019.  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  In addition, she reports that she is unable to sing.  She states that overall the cough is "much better" compared to when she first came in to be seen, however she is hoping for further improvement.  Based upon misunderstanding instructions from her previous visit, she has stopped taking Flovent.  In addition, she is currently not taking fluticasone nasal spray or azelastine nasal spray.  She is currently taking montelukast 10 mg daily, carbinoxamine as needed, and Dexilant 60 mg daily.  She describes an irritation in the base of her throat occasionally wheezes, particularly in the evening.   Assessment and plan: Mild persistent asthma  Restart Flovent 110 g, 2 inhalations via spacer device twice daily.  Continue montelukast 10 mg daily at bedtime.  Continue albuterol HFA, 1 to 2 inhalations every 4-6 hours if needed.  Subjective and objective measures of pulmonary function will be followed and the treatment plan will be adjusted accordingly.  Chronic rhinitis  Restart azelastine nasal spray, 1 spray per nostril twice daily.  Restart fluticasone nasal spray, 1 spray per nostril twice daily.  Nasal saline lavage (NeilMed) has been recommended as needed and prior to medicated nasal sprays along with instructions for proper administration.  Continue carbinoxamine 4 mg every 6-8 hours if  needed.  For thick post nasal drainage, add guaifenesin 1200 mg (Mucinex Maximum Strength)  twice daily as needed with adequate hydration as discussed.  Acid reflux  Appropriate reflux lifestyle modifications have been provided.  For now, continue Dexilant 60 mg daily, 30 minutes prior to breakfast.  A prescription has been provided for famotidine (Pepcid) 20 mg twice daily.  Cough, persistent Multifactorial.  Treatment plan as outlined above.  We will regroup in 6 weeks to assess progress and hopefully start tapering back medications at that time.  If this problem persists or progresses despite the treatment plan as outlined above, a referral will be made to otolaryngology, Dr. Benjamine Mola.   Meds ordered this encounter  Medications  . famotidine (PEPCID) 20 MG tablet    Sig: Take 1 tablet twice a day for reflux    Dispense:  64 tablet    Refill:  5  . FLOVENT HFA 110 MCG/ACT inhaler    Sig: 2 puffs twice a day with spacer to prevent coughing or wheezing. Rinse,gargle and spit after use.    Dispense:  1 Inhaler    Refill:  5    Diagnostics: Spirometry:  Normal with an FEV1 of 95% predicted.  This study was performed while the patient was asymptomatic.  Please see scanned spirometry results for details.   Physical examination: Blood pressure 134/88, pulse 88, temperature 98 F (36.7 C), temperature source Oral, resp. rate 16, height 5\' 5"  (1.651 m), weight 215 lb 6.2 oz (97.7 kg), SpO2 98 %.  General: Alert, interactive, in no acute distress. HEENT: TMs pearly gray, turbinates mildly edematous with clear discharge, post-pharynx moderately erythematous. Neck: Supple without  lymphadenopathy. Lungs: Clear to auscultation without wheezing, rhonchi or rales. CV: Normal S1, S2 without murmurs. Skin: Warm and dry, without lesions or rashes.  The following portions of the patient's history were reviewed and updated as appropriate: allergies, current medications, past family history,  past medical history, past social history, past surgical history and problem list.  Current Outpatient Medications  Medication Sig Dispense Refill  . albuterol (PROAIR HFA) 108 (90 Base) MCG/ACT inhaler Inhale 2 puffs into the lungs every 4 (four) hours as needed for wheezing or shortness of breath. 8 g 2  . anastrozole (ARIMIDEX) 1 MG tablet Take 1 mg by mouth daily.    Marland Kitchen atorvastatin (LIPITOR) 80 MG tablet Take 1 tablet (80 mg total) by mouth daily. 90 tablet 3  . buPROPion (ZYBAN) 150 MG 12 hr tablet TAKE 1 TABLET BY MOUTH TWICE A DAY 60 tablet 8  . Carbinoxamine Maleate 4 MG TABS Take 1 tablet by mouth every 6 (six) hours as needed.    . clonazePAM (KLONOPIN) 1 MG tablet TAKE 0.5-1 TABLETS (0.5-1 MG TOTAL) BY MOUTH AS NEEDED FOR ANXIETY. 30 tablet 0  . dexlansoprazole (DEXILANT) 60 MG capsule Take 1 capsule (60 mg total) by mouth daily before breakfast. 30 capsule 5  . montelukast (SINGULAIR) 10 MG tablet Take 1 tablet (10 mg total) by mouth at bedtime. 30 tablet 5  . Respiratory Therapy Supplies (FLUTTER) DEVI Use as directed 1 each 0  . traZODone (DESYREL) 50 MG tablet TAKE 0.5-1 TABLETS (25-50 MG TOTAL) BY MOUTH AT BEDTIME AS NEEDED FOR SLEEP. 30 tablet 8  . famotidine (PEPCID) 20 MG tablet Take 1 tablet twice a day for reflux 64 tablet 5  . FLOVENT HFA 110 MCG/ACT inhaler 2 puffs twice a day with spacer to prevent coughing or wheezing. Rinse,gargle and spit after use. 1 Inhaler 5   No current facility-administered medications for this visit.    No Known Allergies  Review of systems: Review of systems negative except as noted in HPI / PMHx.  Past Medical History:  Diagnosis Date  . Anxiety   . Asthma   . Cancer ([place]) 2016   Breast  . Hyperlipidemia   . Personal history of radiation therapy     Family History  Problem Relation Age of Onset  . Asthma Mother   . Allergic rhinitis Mother   . Diabetes Father        type 2  . Heart disease Father   . Asthma Sister   .  Allergic rhinitis Sister   . Cancer Brother        prostate  . Asthma Sister   . Cancer Sister     Social History   Socioeconomic History  . Marital status: Married    Spouse name: Not on file  . Number of children: Not on file  . Years of education: Not on file  . Highest education level: Not on file  Occupational History  . Not on file  Tobacco Use  . Smoking status: Never Smoker  . Smokeless tobacco: Never Used  Substance and Sexual Activity  . Alcohol use: Yes    Comment: rarely  . Drug use: Never  . Sexual activity: Not on file  Other Topics Concern  . Not on file  Social History Narrative  . Not on file   Social Determinants of Health   Financial Resource Strain:   . Difficulty of Paying Living Expenses:   Food Insecurity:   . Worried About Estate manager/land agent  of Food in the Last Year:   . [place] in the Last Year:   Transportation Needs:   . Lack of Transportation (Medical):   Marland Kitchen Lack of Transportation (Non-Medical):   Physical Activity:   . Days of Exercise per Week:   . Minutes of Exercise per Session:   Stress:   . Feeling of Stress :   Social Connections:   . Frequency of Communication with Friends and Family:   . Frequency of Social Gatherings with Friends and Family:   . Attends Religious Services:   . Active Member of Clubs or Organizations:   . Attends Archivist Meetings:   Marland Kitchen Marital Status:   Intimate Partner Violence:   . Fear of Current or Ex-Partner:   . Emotionally Abused:   Marland Kitchen Physically Abused:   . Sexually Abused:     I appreciate the opportunity to take part in Arpi's care. Please do not hesitate to contact me with questions.  Sincerely,   R. Edgar Frisk, MD

## 2019-11-10 NOTE — Patient Instructions (Addendum)
Mild persistent asthma  Restart Flovent 110 g, 2 inhalations via spacer device twice daily.  Continue montelukast 10 mg daily at bedtime.  Continue albuterol HFA, 1 to 2 inhalations every 4-6 hours if needed.  Subjective and objective measures of pulmonary function will be followed and the treatment plan will be adjusted accordingly.  Chronic rhinitis  Restart azelastine nasal spray, 1 spray per nostril twice daily.  Restart fluticasone nasal spray, 1 spray per nostril twice daily.  Nasal saline lavage (NeilMed) has been recommended as needed and prior to medicated nasal sprays along with instructions for proper administration.  Continue carbinoxamine 4 mg every 6-8 hours if needed.  For thick post nasal drainage, add guaifenesin 1200 mg (Mucinex Maximum Strength)  twice daily as needed with adequate hydration as discussed.  Acid reflux  Appropriate reflux lifestyle modifications have been provided.  For now, continue Dexilant 60 mg daily, 30 minutes prior to breakfast.  A prescription has been provided for famotidine (Pepcid) 20 mg twice daily.  Cough, persistent Multifactorial.  Treatment plan as outlined above.  We will regroup in 6 weeks to assess progress and hopefully start tapering back medications at that time.  If this problem persists or progresses despite the treatment plan as outlined above, a referral will be made to otolaryngology, Dr. Benjamine Mola.   Return in about 6 weeks (around 12/22/2019), or if symptoms worsen or fail to improve.

## 2019-11-10 NOTE — Assessment & Plan Note (Signed)
   Appropriate reflux lifestyle modifications have been provided.  For now, continue Dexilant 60 mg daily, 30 minutes prior to breakfast.  A prescription has been provided for famotidine (Pepcid) 20 mg twice daily.

## 2019-11-10 NOTE — Assessment & Plan Note (Signed)
   Restart Flovent 110 g, 2 inhalations via spacer device twice daily.  Continue montelukast 10 mg daily at bedtime.  Continue albuterol HFA, 1 to 2 inhalations every 4-6 hours if needed.  Subjective and objective measures of pulmonary function will be followed and the treatment plan will be adjusted accordingly.

## 2019-11-10 NOTE — Assessment & Plan Note (Signed)
   Restart azelastine nasal spray, 1 spray per nostril twice daily.  Restart fluticasone nasal spray, 1 spray per nostril twice daily.  Nasal saline lavage (NeilMed) has been recommended as needed and prior to medicated nasal sprays along with instructions for proper administration.  Continue carbinoxamine 4 mg every 6-8 hours if needed.  For thick post nasal drainage, add guaifenesin 1200 mg (Mucinex Maximum Strength)  twice daily as needed with adequate hydration as discussed.

## 2019-11-10 NOTE — Assessment & Plan Note (Signed)
Multifactorial.  Treatment plan as outlined above.  We will regroup in 6 weeks to assess progress and hopefully start tapering back medications at that time.  If this problem persists or progresses despite the treatment plan as outlined above, a referral will be made to otolaryngology, Dr. Benjamine Mola.

## 2019-11-17 ENCOUNTER — Telehealth: Payer: Self-pay

## 2019-11-17 NOTE — Telephone Encounter (Signed)
Patient has been referred to Dr Benjamine Mola for review and scheduling.  I left the patient a detailed voicemail with this information.  Veronica Raynelle Bring, MD, [address] [place], [place] [address] P: [phone]

## 2019-11-17 NOTE — Telephone Encounter (Signed)
-----   Message from [place], [place] sent at 11/10/2019  5:19 PM EDT ----- Patient needs a referral to see Dr.Teoh for persistent cough. Can go M-F anytime after 12 noon. Per Dr. Verlin Fester

## 2019-11-30 ENCOUNTER — Other Ambulatory Visit: Payer: Self-pay | Admitting: Allergy and Immunology

## 2019-12-07 ENCOUNTER — Other Ambulatory Visit: Payer: Self-pay | Admitting: Family Medicine

## 2019-12-07 DIAGNOSIS — F418 Other specified anxiety disorders: Secondary | ICD-10-CM

## 2019-12-08 NOTE — Telephone Encounter (Signed)
Last OV---09/08/2019 Last RF---#30 no refills on 11/03/2019 CSC/UDS------08/28/2019

## 2019-12-10 ENCOUNTER — Telehealth: Payer: Self-pay

## 2019-12-10 NOTE — Telephone Encounter (Signed)
Really glad to hear you're doing better! Thanks for the report and I'll see you in September.

## 2019-12-10 NOTE — Telephone Encounter (Signed)
Meno, Adron Bene, [place] Muta, MD 50 minutes ago (11:30 AM)  [place] Dear Dr. Verlin Fester, I know you are not going to believe this :) but I am doing [facility] better. I am so grateful to you for your amazing care for me. The visit with Dr. Benjamine Mola has been canceled. I will see you for the last (I guess) check up on September 14. 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 I will continue to take all medications until I see you in September. Blessings, Tito Dine

## 2020-01-21 ENCOUNTER — Inpatient Hospital Stay: Payer: BC Managed Care – PPO

## 2020-01-21 ENCOUNTER — Ambulatory Visit: Payer: BC Managed Care – PPO | Admitting: Hematology

## 2020-01-21 ENCOUNTER — Inpatient Hospital Stay: Payer: BC Managed Care – PPO | Admitting: Hematology & Oncology

## 2020-01-21 ENCOUNTER — Other Ambulatory Visit: Payer: BC Managed Care – PPO

## 2020-01-22 ENCOUNTER — Other Ambulatory Visit: Payer: Self-pay | Admitting: Family Medicine

## 2020-01-22 DIAGNOSIS — F418 Other specified anxiety disorders: Secondary | ICD-10-CM

## 2020-01-28 ENCOUNTER — Other Ambulatory Visit: Payer: Self-pay | Admitting: Allergy and Immunology

## 2020-02-01 ENCOUNTER — Other Ambulatory Visit: Payer: BC Managed Care – PPO

## 2020-02-01 ENCOUNTER — Ambulatory Visit: Payer: BC Managed Care – PPO | Admitting: Hematology & Oncology

## 2020-02-01 ENCOUNTER — Other Ambulatory Visit: Payer: Self-pay | Admitting: Family Medicine

## 2020-02-01 DIAGNOSIS — E785 Hyperlipidemia, unspecified: Secondary | ICD-10-CM

## 2020-02-03 ENCOUNTER — Other Ambulatory Visit: Payer: Self-pay

## 2020-02-03 DIAGNOSIS — J343 Hypertrophy of nasal turbinates: Secondary | ICD-10-CM | POA: Diagnosis not present

## 2020-02-03 DIAGNOSIS — R05 Cough: Secondary | ICD-10-CM | POA: Diagnosis not present

## 2020-02-03 DIAGNOSIS — H938X3 Other specified disorders of ear, bilateral: Secondary | ICD-10-CM | POA: Diagnosis not present

## 2020-02-03 DIAGNOSIS — Z8616 Personal history of COVID-19: Secondary | ICD-10-CM | POA: Diagnosis not present

## 2020-02-03 MED ORDER — ANASTROZOLE 1 MG PO TABS: 1.0000 mg | ORAL_TABLET | Freq: Every day | ORAL | 0 refills | Status: DC

## 2020-02-15 ENCOUNTER — Inpatient Hospital Stay (HOSPITAL_BASED_OUTPATIENT_CLINIC_OR_DEPARTMENT_OTHER): Payer: BC Managed Care – PPO | Admitting: Family

## 2020-02-15 ENCOUNTER — Inpatient Hospital Stay: Payer: BC Managed Care – PPO | Attending: Hematology & Oncology

## 2020-02-15 ENCOUNTER — Telehealth: Payer: Self-pay | Admitting: Family

## 2020-02-15 ENCOUNTER — Other Ambulatory Visit: Payer: Self-pay

## 2020-02-15 DIAGNOSIS — Z79899 Other long term (current) drug therapy: Secondary | ICD-10-CM | POA: Insufficient documentation

## 2020-02-15 DIAGNOSIS — Z79811 Long term (current) use of aromatase inhibitors: Secondary | ICD-10-CM | POA: Insufficient documentation

## 2020-02-15 DIAGNOSIS — C50411 Malignant neoplasm of upper-outer quadrant of right female breast: Secondary | ICD-10-CM

## 2020-02-15 DIAGNOSIS — Z170 Estrogen receptor positive status [ER+]: Secondary | ICD-10-CM | POA: Insufficient documentation

## 2020-02-15 LAB — CMP (CANCER CENTER ONLY)
ALT: 24 U/L (ref 0–44)
AST: 22 U/L (ref 15–41)
Albumin: 4.6 g/dL (ref 3.5–5.0)
Alkaline Phosphatase: 75 U/L (ref 38–126)
Anion gap: 8 (ref 5–15)
BUN: 16 mg/dL (ref 6–20)
CO2: 28 mmol/L (ref 22–32)
Calcium: 9.7 mg/dL (ref 8.9–10.3)
Chloride: 100 mmol/L (ref 98–111)
Creatinine: 0.78 mg/dL (ref 0.44–1.00)
GFR, Est AFR Am: >60 mL/min (ref >60)
GFR, Estimated: >60 mL/min (ref >60)
Glucose, Bld: 103 mg/dL — ABNORMAL HIGH (ref 70–99)
Potassium: 4.1 mmol/L (ref 3.5–5.1)
Sodium: 136 mmol/L (ref 135–145)
Total Bilirubin: 0.8 mg/dL (ref 0.3–1.2)
Total Protein: 7.4 g/dL (ref 6.5–8.1)

## 2020-02-15 LAB — CBC WITH DIFFERENTIAL (CANCER CENTER ONLY)
Abs Immature Granulocytes: 0.01 10*3/uL (ref 0.00–0.07)
Basophils Absolute: 0 10*3/uL (ref 0.0–0.1)
Basophils Relative: 0 %
Eosinophils Absolute: 0.1 10*3/uL (ref 0.0–0.5)
Eosinophils Relative: 2 %
HCT: 41.4 % (ref 36.0–46.0)
Hemoglobin: 13.5 g/dL (ref 12.0–15.0)
Immature Granulocytes: 0 %
Lymphocytes Relative: 23 %
Lymphs Abs: 1.3 10*3/uL (ref 0.7–4.0)
MCH: 31.4 pg (ref 26.0–34.0)
MCHC: 32.6 g/dL (ref 30.0–36.0)
MCV: 96.3 fL (ref 80.0–100.0)
Monocytes Absolute: 0.6 10*3/uL (ref 0.1–1.0)
Monocytes Relative: 11 %
Neutro Abs: 3.6 10*3/uL (ref 1.7–7.7)
Neutrophils Relative %: 64 %
Platelet Count: 270 10*3/uL (ref 150–400)
RBC: 4.3 MIL/uL (ref 3.87–5.11)
RDW: 13.4 % (ref 11.5–15.5)
WBC Count: 5.6 10*3/uL (ref 4.0–10.5)
nRBC: 0 % (ref 0.0–0.2)

## 2020-02-15 NOTE — Telephone Encounter (Signed)
Appointments scheduled calendar printed & mailed per 9/13 los

## 2020-02-15 NOTE — Progress Notes (Signed)
Hematology and Oncology Follow Up Visit  Veronica Lloyd [id number] March 19, 1961 59 y.o. 02/15/2020   Principle Diagnosis:  Stage I (pT1bpN0Mx) ILC of the right breast, ER/PR+, HER2-  Past Therapy: 03/2015: lumpectomy w/ SLN bx  Path: grade 1 ILC, 60m tumor, ER 86%, PR 48%, HER2- (2+ IHC, FISH non-amplified), margins negative, 0/1 SLN  Adjuvant RT completed in 05/2015 Tamoxifene 06/2015 - 08/2016 TAH/BSO in 07/2016  Current Therapy:   08/2016 - present: Arimidex    Interim History:  Veronica Lloyd here today for follow-up. She is doing quite well and has no complaints at this time.  Her cough is much better. She continues to see her allergist and follows up again tomorrow.  She is tolerating Arimidex nicely and had no adverse effects.  No hot flashes or night sweats.  Bilateral breast exam today was negative. No mass, lesion or rash noted.  No adenopathy noted on exam.  No fever, chills, n/v, cough, rash, dizziness, SOB, chest pain, palpitations, abdominal pain or changes in bowel or bladder habits.  No swelling, tenderness, numbness or tingling in her extremities.  No falls or syncopal episodes to report.  She has maintained a good appetite and is staying well hydrated. Her weight is stable.   ECOG Performance Status: 1 - Symptomatic but completely ambulatory  Medications:  Allergies as of 02/15/2020   No Known Allergies     Medication List       Accurate as of February 15, 2020  1:13 PM. If you have any questions, ask your nurse or doctor.        albuterol 108 (90 Base) MCG/ACT inhaler Commonly known as: ProAir HFA Inhale 2 puffs into the lungs every 4 (four) hours as needed for wheezing or shortness of breath.   anastrozole 1 MG tablet Commonly known as: ARIMIDEX Take 1 tablet (1 mg total) by mouth daily.   atorvastatin 40 MG tablet Commonly known as: LIPITOR TAKE 1 TABLET BY MOUTH EVERY DAY What changed: Another medication with the same name was removed.  Continue taking this medication, and follow the directions you see here.   azelastine 0.1 % nasal spray Commonly known as: ASTELIN PLACE 2 SPRAYS INTO BOTH NOSTRILS 2 (TWO) TIMES DAILY. USE IN EACH NOSTRIL AS DIRECTED   buPROPion 150 MG 12 hr tablet Commonly known as: ZYBAN TAKE 1 TABLET BY MOUTH TWICE A DAY   Carbinoxamine Maleate 4 MG Tabs Take 1 tablet by mouth every 6 (six) hours as needed.   clonazePAM 1 MG tablet Commonly known as: KLONOPIN TAKE 0.5-1 TABLETS (0.5-1 MG TOTAL) BY MOUTH AS NEEDED FOR ANXIETY.   Dexilant 60 MG capsule Generic drug: dexlansoprazole Take 1 capsule (60 mg total) by mouth daily before breakfast.   famotidine 20 MG tablet Commonly known as: PEPCID Take 1 tablet twice a day for reflux   Flovent HFA 110 MCG/ACT inhaler Generic drug: fluticasone 2 puffs twice a day with spacer to prevent coughing or wheezing. Rinse,gargle and spit after use.   fluticasone 50 MCG/ACT nasal spray Commonly known as: FLONASE SPRAY 2 SPRAYS INTO EACH NOSTRIL EVERY DAY   Flutter Devi Use as directed   montelukast 10 MG tablet Commonly known as: SINGULAIR Take 1 tablet (10 mg total) by mouth at bedtime.   omeprazole 20 MG capsule Commonly known as: PRILOSEC TAKE 1 CAPSULE BY MOUTH TWICE A DAY BEFORE MEALS   traZODone 50 MG tablet Commonly known as: DESYREL TAKE 0.5-1 TABLETS (25-50 MG TOTAL) BY MOUTH AT BEDTIME AS  NEEDED FOR SLEEP.       Allergies: No Known Allergies  Past Medical History, Surgical history, Social history, and Family History were reviewed and updated.  Review of Systems: All other 10 point review of systems is negative.   Physical Exam:  vitals were not taken for this visit.   Wt Readings from Last 3 Encounters:  02/15/20 213 lb 8 oz (96.8 kg)  11/10/19 215 lb 6.2 oz (97.7 kg)  09/28/19 209 lb (94.8 kg)    Ocular: Sclerae unicteric, pupils equal, round and reactive to light Ear-nose-throat: Oropharynx clear, dentition  fair Lymphatic: No cervical, supraclavicular or axillary adenopathy Lungs no rales or rhonchi, good excursion bilaterally Heart regular rate and rhythm, no murmur appreciated Abd soft, nontender, positive bowel sounds MSK no focal spinal tenderness, no joint edema Neuro: non-focal, well-oriented, appropriate affect Breasts: Bilateral breast exam today was negative. No mass, lesion or rash noted.   Lab Results  Component Value Date   WBC 5.0 07/24/2019   HGB 13.0 07/24/2019   HCT 39.3 07/24/2019   MCV 94.7 07/24/2019   PLT 263 07/24/2019   No results found for: FERRITIN, IRON, TIBC, UIBC, IRONPCTSAT Lab Results  Component Value Date   RBC 4.15 07/24/2019   No results found for: KPAFRELGTCHN, LAMBDASER, KAPLAMBRATIO No results found for: IGGSERUM, IGA, IGMSERUM No results found for: Veronica Lloyd, SPEI   Chemistry      Component Value Date/Time   NA 138 07/24/2019 0941   K 4.2 07/24/2019 0941   CL 100 07/24/2019 0941   CO2 31 07/24/2019 0941   BUN 14 07/24/2019 0941   CREATININE 0.83 07/24/2019 0941   CREATININE 0.82 02/27/2019 1625      Component Value Date/Time   CALCIUM 10.0 07/24/2019 0941   ALKPHOS 60 07/24/2019 0941   AST 20 07/24/2019 0941   ALT 24 07/24/2019 0941   BILITOT 0.8 07/24/2019 0941       Impression and Plan: Veronica Lloyd is a very pleasant 59 yo [place] female with history of stage I (pT1bpN0Mx) ILC of the right breast, ER/PR+, HER2-. She has done very well with treatment and so far there has been no evidence of recurrence.  She is doing well on Arimidex and has no complaints at this time.  I spoke with Dr. Marin Olp and she will complete her treatment with Arimidex in March 2022.  Her mammogram in due again in October and she just received her letter to schedule.  Follow-up in 6 months.  She can contact our office with any questions or concerns.   [name], NP 9/13/20211:13 PM

## 2020-02-16 ENCOUNTER — Ambulatory Visit (INDEPENDENT_AMBULATORY_CARE_PROVIDER_SITE_OTHER): Payer: BC Managed Care – PPO | Admitting: Allergy and Immunology

## 2020-02-16 ENCOUNTER — Encounter: Payer: Self-pay | Admitting: Allergy and Immunology

## 2020-02-16 VITALS — BP 134/94 | HR 90 | Temp 98.6°F | Resp 16 | Ht 65.0 in | Wt 215.0 lb

## 2020-02-16 DIAGNOSIS — R05 Cough: Secondary | ICD-10-CM

## 2020-02-16 DIAGNOSIS — R030 Elevated blood-pressure reading, without diagnosis of hypertension: Secondary | ICD-10-CM | POA: Insufficient documentation

## 2020-02-16 DIAGNOSIS — R053 Chronic cough: Secondary | ICD-10-CM

## 2020-02-16 NOTE — Assessment & Plan Note (Signed)
   The patient has been made aware of the elevated blood pressure reading and has been encouraged to follow up with her primary care physician in the near future regarding this issue.

## 2020-02-16 NOTE — Patient Instructions (Addendum)
Cough, persistent Improved since having started gabapentin.  Continue gabapentin as prescribed by Dr. Redmond Baseman.  Gradually, and in a stepwise fashion, start decreasing the other medications you are taking related to the cough.  Elevated blood-pressure reading without diagnosis of hypertension  The patient has been made aware of the elevated blood pressure reading and has been encouraged to follow up with her primary care physician in the near future regarding this issue.   Follow-up if needed

## 2020-02-16 NOTE — Assessment & Plan Note (Signed)
Improved since having started gabapentin.  Continue gabapentin as prescribed by Dr. Redmond Baseman.  Gradually, and in a stepwise fashion, start decreasing the other medications you are taking related to the cough.

## 2020-02-16 NOTE — Progress Notes (Signed)
Follow-up Note  RE: Veronica Lloyd MRN: [id number] DOB: 09/16/1960 Date of Office Visit: 02/16/2020  Primary care provider: [name], DO Referring provider: Shelda Lloyd*  History of present illness: Veronica Lloyd is a 59 y.o. female with a history of persistent cough, wheezing, chronic rhinitis, and acid reflux presenting today for follow-up.  She was last seen in this clinic on November 10, 2019.  She reports that her cough improved significantly after starting gabapentin 100 mg 3 times daily.  The cough had improved somewhat while taking medications to reduce inflammation in the upper and lower airways, reduce mucus viscosity, and reduce acid reflux.  However, the cough persisted and so she saw her otolaryngologist, Dr. Redmond Baseman who prescribed the gabapentin for neurogenic cough.  Assessment and plan: Cough, persistent Improved since having started gabapentin.  Continue gabapentin as prescribed by Dr. Redmond Baseman.  Gradually, and in a stepwise fashion, start decreasing the other medications you are taking related to the cough.  Elevated blood-pressure reading without diagnosis of hypertension  The patient has been made aware of the elevated blood pressure reading and has been encouraged to follow up with her primary care physician in the near future regarding this issue.   Diagnostics: Spirometry:  Normal with an FEV1 of 96% predicted.  Please see scanned spirometry results for details.    Physical examination: Blood pressure (!) 134/94, pulse 90, temperature 98.6 F (37 C), temperature source Oral, resp. rate 16, height 5\' 5"  (1.651 m), weight 215 lb (97.5 kg), SpO2 97 %.  General: Alert, interactive, in no acute distress. HEENT: TMs pearly gray, turbinates mildly edematous without discharge, post-pharynx mildly erythematous. Neck: Supple without lymphadenopathy. Lungs: Clear to auscultation without wheezing, rhonchi or rales. CV: Normal S1, S2 without  murmurs. Skin: Warm and dry, without lesions or rashes.  The following portions of the patient's history were reviewed and updated as appropriate: allergies, current medications, past family history, past medical history, past social history, past surgical history and problem list.  Current Outpatient Medications  Medication Sig Dispense Refill  . anastrozole (ARIMIDEX) 1 MG tablet Take 1 tablet (1 mg total) by mouth daily. 90 tablet 0  . atorvastatin (LIPITOR) 40 MG tablet TAKE 1 TABLET BY MOUTH EVERY DAY 30 tablet 8  . azelastine (ASTELIN) 0.1 % nasal spray PLACE 2 SPRAYS INTO BOTH NOSTRILS 2 (TWO) TIMES DAILY. USE IN EACH NOSTRIL AS DIRECTED 90 mL 0  . buPROPion (ZYBAN) 150 MG 12 hr tablet TAKE 1 TABLET BY MOUTH TWICE A DAY 60 tablet 8  . Carbinoxamine Maleate 4 MG TABS Take 1 tablet by mouth every 6 (six) hours as needed.    . clonazePAM (KLONOPIN) 1 MG tablet TAKE 0.5-1 TABLETS (0.5-1 MG TOTAL) BY MOUTH AS NEEDED FOR ANXIETY. 30 tablet 4  . dexlansoprazole (DEXILANT) 60 MG capsule Take 1 capsule (60 mg total) by mouth daily before breakfast. 30 capsule 5  . famotidine (PEPCID) 20 MG tablet Take 1 tablet twice a day for reflux 64 tablet 5  . FLOVENT HFA 110 MCG/ACT inhaler 2 puffs twice a day with spacer to prevent coughing or wheezing. Rinse,gargle and spit after use. 1 Inhaler 5  . fluticasone (FLONASE) 50 MCG/ACT nasal spray SPRAY 2 SPRAYS INTO EACH NOSTRIL EVERY DAY 48 mL 0  . montelukast (SINGULAIR) 10 MG tablet Take 1 tablet (10 mg total) by mouth at bedtime. 30 tablet 5  . omeprazole (PRILOSEC) 20 MG capsule TAKE 1 CAPSULE BY MOUTH TWICE A DAY BEFORE MEALS  60 capsule 1  . Respiratory Therapy Supplies (FLUTTER) DEVI Use as directed 1 each 0  . traZODone (DESYREL) 50 MG tablet TAKE 0.5-1 TABLETS (25-50 MG TOTAL) BY MOUTH AT BEDTIME AS NEEDED FOR SLEEP. 90 tablet 3  . albuterol (PROAIR HFA) 108 (90 Base) MCG/ACT inhaler Inhale 2 puffs into the lungs every 4 (four) hours as needed for  wheezing or shortness of breath. (Patient not taking: Reported on 02/16/2020) 8 g 2   No current facility-administered medications for this visit.    No Known Allergies  I appreciate the opportunity to take part in Veronica Lloyd's care. Please do not hesitate to contact me with questions.  Sincerely,   R. Edgar Frisk, MD

## 2020-02-19 ENCOUNTER — Other Ambulatory Visit: Payer: Self-pay | Admitting: Allergy and Immunology

## 2020-02-29 ENCOUNTER — Encounter: Payer: BC Managed Care – PPO | Admitting: Family Medicine

## 2020-03-05 ENCOUNTER — Other Ambulatory Visit: Payer: Self-pay | Admitting: Pediatrics

## 2020-03-14 ENCOUNTER — Encounter: Payer: Self-pay | Admitting: Family Medicine

## 2020-03-14 ENCOUNTER — Ambulatory Visit (INDEPENDENT_AMBULATORY_CARE_PROVIDER_SITE_OTHER): Payer: BC Managed Care – PPO | Admitting: Family Medicine

## 2020-03-14 ENCOUNTER — Other Ambulatory Visit: Payer: Self-pay

## 2020-03-14 VITALS — BP 122/86 | HR 85 | Temp 98.4°F | Ht 65.0 in | Wt 214.2 lb

## 2020-03-14 DIAGNOSIS — R739 Hyperglycemia, unspecified: Secondary | ICD-10-CM

## 2020-03-14 DIAGNOSIS — Z23 Encounter for immunization: Secondary | ICD-10-CM

## 2020-03-14 DIAGNOSIS — Z1211 Encounter for screening for malignant neoplasm of colon: Secondary | ICD-10-CM

## 2020-03-14 DIAGNOSIS — E785 Hyperlipidemia, unspecified: Secondary | ICD-10-CM

## 2020-03-14 DIAGNOSIS — Z0000 Encounter for general adult medical examination without abnormal findings: Secondary | ICD-10-CM | POA: Diagnosis not present

## 2020-03-14 NOTE — Patient Instructions (Addendum)
Give [place] 2-3 business days to get the results of your labs back.   Keep the diet clean and stay active.  Stop the Pepcid if you do well off of the Dexilant. I would remove the Flovent next followed by the Singulair and then the Flonase. I would give each change a couple weeks before moving to the next. If the cough returns, go back on the medicine.   If you do not hear anything about your referral in the next 1-2 weeks, call our office and ask for an update.  OK to use Debrox (peroxide) in the ear to loosen up wax. Also recommend using a bulb syringe (for removing boogers from baby's noses) to flush through warm water. Do not use Q-tips as this can impact wax further.  Let us know if you need anything.

## 2020-03-14 NOTE — Progress Notes (Signed)
Chief Complaint  Patient presents with  . Annual Exam     Well Woman Veronica Lloyd is here for a complete physical.   Her last physical was >1 year ago.  Current diet: in general, a "healthy" diet. Current exercise: cardio, lifting. Weight is stable and she denies fatigue out of ordinary.  Seatbelt? Yes  Health Maintenance Mammogram- Yes Colon cancer screening-No Shingrix- Yes- does not remember date  Tetanus- Yes Hep C screening- Yes HIV screening- Yes  Past Medical History:  Diagnosis Date  . Anxiety   . Asthma   . Cancer ([place]) 2016   Breast  . Hyperlipidemia   . Personal history of radiation therapy      Past Surgical History:  Procedure Laterality Date  . ABDOMINAL HYSTERECTOMY  2018   complete  . BREAST LUMPECTOMY Right 2016    Medications  Current Outpatient Medications on File Prior to Visit  Medication Sig Dispense Refill  . anastrozole (ARIMIDEX) 1 MG tablet Take 1 tablet (1 mg total) by mouth daily. 90 tablet 0  . atorvastatin (LIPITOR) 40 MG tablet TAKE 1 TABLET BY MOUTH EVERY DAY 30 tablet 8  . buPROPion (ZYBAN) 150 MG 12 hr tablet TAKE 1 TABLET BY MOUTH TWICE A DAY 60 tablet 8  . Carbinoxamine Maleate 4 MG TABS Take 1 tablet by mouth every 6 (six) hours as needed.    . clonazePAM (KLONOPIN) 1 MG tablet TAKE 0.5-1 TABLETS (0.5-1 MG TOTAL) BY MOUTH AS NEEDED FOR ANXIETY. 30 tablet 4  . DEXILANT 60 MG capsule TAKE 1 CAPSULE (60 MG TOTAL) BY MOUTH DAILY BEFORE BREAKFAST. 30 capsule 5  . famotidine (PEPCID) 20 MG tablet Take 1 tablet twice a day for reflux 64 tablet 5  . FLOVENT HFA 110 MCG/ACT inhaler 2 puffs twice a day with spacer to prevent coughing or wheezing. Rinse,gargle and spit after use. 1 Inhaler 5  . fluticasone (FLONASE) 50 MCG/ACT nasal spray SPRAY 2 SPRAYS INTO EACH NOSTRIL EVERY DAY 48 mL 0  . montelukast (SINGULAIR) 10 MG tablet TAKE 1 TABLET BY MOUTH EVERYDAY AT BEDTIME 30 tablet 5  . omeprazole (PRILOSEC) 20 MG capsule TAKE 1 CAPSULE  BY MOUTH TWICE A DAY BEFORE MEALS 60 capsule 1  . Respiratory Therapy Supplies (FLUTTER) DEVI Use as directed 1 each 0  . traZODone (DESYREL) 50 MG tablet TAKE 0.5-1 TABLETS (25-50 MG TOTAL) BY MOUTH AT BEDTIME AS NEEDED FOR SLEEP. 90 tablet 3  . gabapentin (NEURONTIN) 100 MG capsule Take 100 mg by mouth 3 (three) times daily.     Allergies No Known Allergies  Review of Systems: Constitutional:  no unexpected weight changes Eye:  no recent significant change in vision Ear/Nose/Mouth/Throat:  Ears:  no recent change in hearing Nose/Mouth/Throat:  no complaints of nasal congestion, no sore throat Cardiovascular: no chest pain Respiratory:  no shortness of breath Gastrointestinal:  no abdominal pain, no change in bowel habits GU:  Female: negative for dysuria or pelvic pain Musculoskeletal/Extremities:  no pain of the joints Integumentary (Skin/Breast):  no abnormal skin lesions reported Neurologic:  no headaches Endocrine:  denies fatigue  Exam BP 122/86 (BP Location: Left Arm, Patient Position: Sitting, Cuff Size: Large)   Pulse 85   Temp 98.4 F (36.9 C) (Oral)   Ht 5\' 5"  (1.651 m)   Wt 214 lb 4 oz (97.2 kg)   SpO2 98%   BMI 35.65 kg/m  General:  well developed, well nourished, in no apparent distress Skin:  no significant moles, warts,  or growths Head:  no masses, lesions, or tenderness Eyes:  pupils equal and round, sclera anicteric without injection Ears:  canals without lesions, TMs shiny without retraction, no obvious effusion, no erythema Nose:  nares patent, septum midline, mucosa normal, and no drainage or sinus tenderness Throat/Pharynx:  lips and gingiva without lesion; tongue and uvula midline; non-inflamed pharynx; no exudates or postnasal drainage Neck: neck supple without adenopathy, thyromegaly, or masses Lungs:  clear to auscultation, breath sounds equal bilaterally, no respiratory distress Cardio:  regular rate and rhythm, no LE edema Abdomen:  abdomen soft,  nontender; bowel sounds normal; no masses or organomegaly Genital: Defer to GYN Musculoskeletal:  symmetrical muscle groups noted without atrophy or deformity Extremities:  no clubbing, cyanosis, or edema, no deformities, no skin discoloration Neuro:  gait normal; deep tendon reflexes normal and symmetric Psych: well oriented with normal range of affect and appropriate judgment/insight  Assessment and Plan  Well adult exam  Need for influenza vaccination - Plan: Flu Vaccine QUAD 6+ mos PF IM (Fluarix Quad PF)  Hyperglycemia - Plan: Hemoglobin A1c  Hyperlipidemia, unspecified hyperlipidemia type - Plan: Lipid panel  Screen for colon cancer - Plan: Ambulatory referral to Gastroenterology   Well 59 y.o. female. Counseled on diet and exercise. Other orders as above. CSC updated. UDS in 6 mo for Klonopin.  Follow up in 6 mo or prn. The patient voiced understanding and agreement to the plan.  [place], DO 03/14/20 9:15 AM

## 2020-03-15 LAB — LIPID PANEL
Cholesterol: 225 mg/dL — ABNORMAL HIGH (ref <200)
HDL: 85 mg/dL (ref >=50)
LDL Cholesterol (Calc): 117 mg/dL (calc) — ABNORMAL HIGH
Non-HDL Cholesterol (Calc): 140 mg/dL (calc) — ABNORMAL HIGH (ref <130)
Total CHOL/HDL Ratio: 2.6 (calc) (ref <5.0)
Triglycerides: 120 mg/dL (ref <150)

## 2020-03-15 LAB — HEMOGLOBIN A1C
Hgb A1c MFr Bld: 5.4 % of total Hgb (ref <5.7)
Mean Plasma Glucose: 108 (calc)
eAG (mmol/L): 6 (calc)

## 2020-03-24 ENCOUNTER — Other Ambulatory Visit: Payer: Self-pay | Admitting: *Deleted

## 2020-03-24 ENCOUNTER — Other Ambulatory Visit: Payer: Self-pay

## 2020-03-24 ENCOUNTER — Encounter: Payer: Self-pay | Admitting: Family

## 2020-03-24 DIAGNOSIS — Z853 Personal history of malignant neoplasm of breast: Secondary | ICD-10-CM

## 2020-03-25 ENCOUNTER — Other Ambulatory Visit: Payer: Self-pay | Admitting: Family Medicine

## 2020-03-25 DIAGNOSIS — Z853 Personal history of malignant neoplasm of breast: Secondary | ICD-10-CM

## 2020-04-19 ENCOUNTER — Encounter: Payer: Self-pay | Admitting: Family

## 2020-04-20 ENCOUNTER — Ambulatory Visit
Admission: RE | Admit: 2020-04-20 | Discharge: 2020-04-20 | Disposition: A | Discharge status: Home / Self Care | Payer: BC Managed Care – PPO | Source: Ambulatory Visit | Attending: Family Medicine | Admitting: Family Medicine

## 2020-04-20 ENCOUNTER — Other Ambulatory Visit: Payer: Self-pay

## 2020-04-20 ENCOUNTER — Ambulatory Visit
Admission: RE | Admit: 2020-04-20 | Discharge: 2020-04-20 | Disposition: A | Discharge status: Home / Self Care | Payer: Self-pay | Source: Ambulatory Visit

## 2020-04-20 DIAGNOSIS — R922 Inconclusive mammogram: Secondary | ICD-10-CM | POA: Diagnosis not present

## 2020-04-20 DIAGNOSIS — Z853 Personal history of malignant neoplasm of breast: Secondary | ICD-10-CM

## 2020-04-20 DIAGNOSIS — Z9289 Personal history of other medical treatment: Secondary | ICD-10-CM

## 2020-04-23 ENCOUNTER — Other Ambulatory Visit: Payer: Self-pay | Admitting: Allergy and Immunology

## 2020-04-25 ENCOUNTER — Other Ambulatory Visit: Payer: Self-pay | Admitting: Allergy and Immunology

## 2020-04-26 ENCOUNTER — Other Ambulatory Visit: Payer: Self-pay | Admitting: Hematology & Oncology

## 2020-05-12 ENCOUNTER — Encounter: Payer: BC Managed Care – PPO | Admitting: Gastroenterology

## 2020-06-18 ENCOUNTER — Other Ambulatory Visit: Payer: Self-pay | Admitting: Family Medicine

## 2020-06-18 DIAGNOSIS — F418 Other specified anxiety disorders: Secondary | ICD-10-CM

## 2020-06-20 NOTE — Telephone Encounter (Signed)
Requesting: clonazepam 1mg  Contract: None UDS: 08/28/2019 Last Visit: 03/14/2020 Next Visit: 09/12/2020 Last Refill: 12/08/2019 #30 and 4RF  Please Advise

## 2020-07-18 ENCOUNTER — Ambulatory Visit (AMBULATORY_SURGERY_CENTER): Payer: Self-pay | Admitting: *Deleted

## 2020-07-18 ENCOUNTER — Other Ambulatory Visit: Payer: Self-pay

## 2020-07-18 VITALS — Ht 65.0 in | Wt 213.0 lb

## 2020-07-18 DIAGNOSIS — Z1211 Encounter for screening for malignant neoplasm of colon: Secondary | ICD-10-CM

## 2020-07-18 MED ORDER — PEG 3350-KCL-NA BICARB-NACL 420 G PO SOLR: 4000.0000 mL | Freq: Once | ORAL | 0 refills | Status: AC

## 2020-07-18 NOTE — Progress Notes (Signed)
Patient is here in-person for PV. Patient denies any allergies to eggs or soy. Patient denies any problems with anesthesia/sedation. Patient denies any oxygen use at home. Patient denies taking any diet/weight loss medications or blood thinners. Patient is not being treated for MRSA or C-diff. Patient is aware of our care-partner policy and [id number] safety protocol. EMMI education assigned to the patient for the procedure, sent to [facility].   COVID-19 vaccines completed x3, per patient.  Patient request a cheaper prep.

## 2020-07-26 ENCOUNTER — Telehealth: Payer: Self-pay | Admitting: Gastroenterology

## 2020-07-26 NOTE — Telephone Encounter (Signed)
Pt called and instructed dulcolax laxative 5 mg tabs is over the counter- pt states she understands

## 2020-07-26 NOTE — Telephone Encounter (Signed)
Pt states she only received part of her prep, just the electrolyte and not the dulcolax tablets. \ [place]

## 2020-07-27 ENCOUNTER — Other Ambulatory Visit: Payer: Self-pay | Admitting: Hematology & Oncology

## 2020-07-28 ENCOUNTER — Ambulatory Visit (AMBULATORY_SURGERY_CENTER): Payer: BC Managed Care – PPO | Admitting: Gastroenterology

## 2020-07-28 ENCOUNTER — Encounter: Payer: Self-pay | Admitting: Gastroenterology

## 2020-07-28 ENCOUNTER — Other Ambulatory Visit: Payer: Self-pay

## 2020-07-28 VITALS — BP 141/71 | HR 58 | Temp 97.6°F | Resp 12 | Ht 65.0 in | Wt 213.0 lb

## 2020-07-28 DIAGNOSIS — Z1211 Encounter for screening for malignant neoplasm of colon: Secondary | ICD-10-CM

## 2020-07-28 DIAGNOSIS — D125 Benign neoplasm of sigmoid colon: Secondary | ICD-10-CM

## 2020-07-28 DIAGNOSIS — D123 Benign neoplasm of transverse colon: Secondary | ICD-10-CM | POA: Diagnosis not present

## 2020-07-28 MED ORDER — SODIUM CHLORIDE 0.9 % IV SOLN: 500.0000 mL | Freq: Once | INTRAVENOUS | Status: DC

## 2020-07-28 NOTE — Progress Notes (Signed)
VS-CW  Pt's states no medical or surgical changes since previsit or office visit.  

## 2020-07-28 NOTE — Op Note (Signed)
[facility] Patient Name: Veronica Lloyd Procedure Date: 07/28/2020 7:40 AM MRN: [id number] Endoscopist: Mauri Pole , MD Age: 60 Referring MD:  Date of Birth: 04/14/1961 Gender: Female Account #: [id number] Procedure:                Colonoscopy Indications:              Screening for colorectal malignant neoplasm Medicines:                Monitored Anesthesia Care Procedure:                Pre-Anesthesia Assessment:                           - Prior to the procedure, a History and Physical                            was performed, and patient medications and                            allergies were reviewed. The patient's tolerance of                            previous anesthesia was also reviewed. The risks                            and benefits of the procedure and the sedation                            options and risks were discussed with the patient.                            All questions were answered, and informed consent                            was obtained. Prior Anticoagulants: The patient has                            taken no previous anticoagulant or antiplatelet                            agents. ASA Grade Assessment: III - A patient with                            severe systemic disease. After reviewing the risks                            and benefits, the patient was deemed in                            satisfactory condition to undergo the procedure.                           After obtaining informed consent, the colonoscope  was passed under direct vision. Throughout the                            procedure, the patient's blood pressure, pulse, and                            oxygen saturations were monitored continuously. The                            Olympus PCF-[id number] (#[id number]) Colonoscope was                            introduced through the anus and advanced to the the                            cecum,  identified by appendiceal orifice and                            ileocecal valve. The colonoscopy was performed                            without difficulty. The patient tolerated the                            procedure well. The quality of the bowel                            preparation was good. The ileocecal valve,                            appendiceal orifice, and rectum were photographed. Scope In: 8:13:37 AM Scope Out: 8:34:57 AM Scope Withdrawal Time: 0 hours 13 minutes 4 seconds  Total Procedure Duration: 0 hours 21 minutes 20 seconds  Findings:                 The perianal and digital rectal examinations were                            normal.                           Two sessile polyps were found in the sigmoid colon                            and transverse colon. The polyps were 5 to 7 mm in                            size. These polyps were removed with a cold snare.                            Resection and retrieval were complete.                           A few small-mouthed diverticula were found in the  sigmoid colon.                           Non-bleeding internal hemorrhoids were found during                            retroflexion. The hemorrhoids were medium-sized.                           The exam was otherwise without abnormality. Complications:            No immediate complications. Estimated Blood Loss:     Estimated blood loss was minimal. Impression:               - Two 5 to 7 mm polyps in the sigmoid colon and in                            the transverse colon, removed with a cold snare.                            Resected and retrieved.                           - Diverticulosis in the sigmoid colon.                           - Non-bleeding internal hemorrhoids.                           - The examination was otherwise normal. Recommendation:           - Patient has a contact number available for                             emergencies. The signs and symptoms of potential                            delayed complications were discussed with the                            patient. Return to normal activities tomorrow.                            Written discharge instructions were provided to the                            patient.                           - Resume previous diet.                           - Continue present medications.                           - Await pathology results.                           -  Repeat colonoscopy in 5-10 years for surveillance                            based on pathology results. Mauri Pole, MD 07/28/2020 8:44:42 AM This report has been signed electronically.

## 2020-07-28 NOTE — Patient Instructions (Signed)
YOU HAD AN ENDOSCOPIC PROCEDURE TODAY AT THE [facility]:   Refer to the procedure report that was given to you for any specific questions about what was found during the examination.  If the procedure report does not answer your questions, please call your gastroenterologist to clarify.  If you requested that your care partner not be given the details of your procedure findings, then the procedure report has been included in a sealed envelope for you to review at your convenience later.  YOU SHOULD EXPECT: Some feelings of bloating in the abdomen. Passage of more gas than usual.  Walking can help get rid of the air that was put into your GI tract during the procedure and reduce the bloating. If you had a lower endoscopy (such as a colonoscopy or flexible sigmoidoscopy) you may notice spotting of blood in your stool or on the toilet paper. If you underwent a bowel prep for your procedure, you may not have a normal bowel movement for a few days.  Please Note:  You might notice some irritation and congestion in your nose or some drainage.  This is from the oxygen used during your procedure.  There is no need for concern and it should clear up in a day or so.  SYMPTOMS TO REPORT IMMEDIATELY:   Following lower endoscopy (colonoscopy or flexible sigmoidoscopy):  Excessive amounts of blood in the stool  Significant tenderness or worsening of abdominal pains  Swelling of the abdomen that is new, acute  Fever of 100F or higher   Following upper endoscopy (EGD)  Vomiting of blood or coffee ground material  New chest pain or pain under the shoulder blades  Painful or persistently difficult swallowing  New shortness of breath  Fever of 100F or higher  Black, tarry-looking stools  For urgent or emergent issues, a gastroenterologist can be reached at any hour by calling [phone]. Do not use MyChart messaging for urgent concerns.    DIET:  We do recommend a small meal at first, but  then you may proceed to your regular diet.  Drink plenty of fluids but you should avoid alcoholic beverages for 24 hours.  ACTIVITY:  You should plan to take it easy for the rest of today and you should NOT DRIVE or use heavy machinery until tomorrow (because of the sedation medicines used during the test).    FOLLOW UP: Our staff will call the number listed on your records 48-72 hours following your procedure to check on you and address any questions or concerns that you may have regarding the information given to you following your procedure. If we do not reach you, we will leave a message.  We will attempt to reach you two times.  During this call, we will ask if you have developed any symptoms of COVID 19. If you develop any symptoms (ie: fever, flu-like symptoms, shortness of breath, cough etc.) before then, please call [phone].  If you test positive for Covid 19 in the 2 weeks post procedure, please call and report this information to us.    If any biopsies were taken you will be contacted by phone or by letter within the next 1-3 weeks.  Please call us at [phone] if you have not heard about the biopsies in 3 weeks.    SIGNATURES/CONFIDENTIALITY: You and/or your care partner have signed paperwork which will be entered into your electronic medical record.  These signatures attest to the fact that that the information above on   your After Visit Summary has been reviewed and is understood.  Full responsibility of the confidentiality of this discharge information lies with you and/or your care-partner. 

## 2020-07-28 NOTE — Progress Notes (Signed)
Called to room to assist during endoscopic procedure.  Patient ID and intended procedure confirmed with present staff. Received instructions for my participation in the procedure from the performing physician.  

## 2020-07-28 NOTE — Progress Notes (Signed)
pt tolerated well. VSS. awake and to recovery. Report given to RN.  

## 2020-08-01 ENCOUNTER — Telehealth: Payer: Self-pay | Admitting: *Deleted

## 2020-08-01 ENCOUNTER — Telehealth: Payer: Self-pay

## 2020-08-01 NOTE — Telephone Encounter (Signed)
  Follow up Call-  Call back number 07/28/2020  Post procedure Call Back phone  # [phone]  Permission to leave phone message Yes     Attempted to leave message but mailbox was full.

## 2020-08-01 NOTE — Telephone Encounter (Signed)
Attempted 2nd f/u phone call. No answer. Mailbox is full, unable to leave message.

## 2020-08-09 ENCOUNTER — Encounter: Payer: Self-pay | Admitting: Gastroenterology

## 2020-08-15 ENCOUNTER — Inpatient Hospital Stay (HOSPITAL_BASED_OUTPATIENT_CLINIC_OR_DEPARTMENT_OTHER): Payer: BC Managed Care – PPO | Admitting: Hematology & Oncology

## 2020-08-15 ENCOUNTER — Inpatient Hospital Stay: Payer: BC Managed Care – PPO | Attending: Hematology & Oncology

## 2020-08-15 ENCOUNTER — Encounter: Payer: Self-pay | Admitting: Hematology & Oncology

## 2020-08-15 ENCOUNTER — Telehealth: Payer: Self-pay

## 2020-08-15 ENCOUNTER — Other Ambulatory Visit: Payer: Self-pay

## 2020-08-15 VITALS — BP 142/80 | HR 65 | Temp 98.3°F | Resp 20 | Wt 211.0 lb

## 2020-08-15 DIAGNOSIS — Z853 Personal history of malignant neoplasm of breast: Secondary | ICD-10-CM | POA: Insufficient documentation

## 2020-08-15 DIAGNOSIS — C50011 Malignant neoplasm of nipple and areola, right female breast: Secondary | ICD-10-CM | POA: Diagnosis not present

## 2020-08-15 DIAGNOSIS — Z79899 Other long term (current) drug therapy: Secondary | ICD-10-CM | POA: Insufficient documentation

## 2020-08-15 DIAGNOSIS — Z923 Personal history of irradiation: Secondary | ICD-10-CM | POA: Insufficient documentation

## 2020-08-15 DIAGNOSIS — Z170 Estrogen receptor positive status [ER+]: Secondary | ICD-10-CM

## 2020-08-15 LAB — CBC WITH DIFFERENTIAL (CANCER CENTER ONLY)
Abs Immature Granulocytes: 0.01 10*3/uL (ref 0.00–0.07)
Basophils Absolute: 0 10*3/uL (ref 0.0–0.1)
Basophils Relative: 0 %
Eosinophils Absolute: 0.1 10*3/uL (ref 0.0–0.5)
Eosinophils Relative: 2 %
HCT: 39 % (ref 36.0–46.0)
Hemoglobin: 12.9 g/dL (ref 12.0–15.0)
Immature Granulocytes: 0 %
Lymphocytes Relative: 29 %
Lymphs Abs: 1.3 10*3/uL (ref 0.7–4.0)
MCH: 30.9 pg (ref 26.0–34.0)
MCHC: 33.1 g/dL (ref 30.0–36.0)
MCV: 93.5 fL (ref 80.0–100.0)
Monocytes Absolute: 0.5 10*3/uL (ref 0.1–1.0)
Monocytes Relative: 12 %
Neutro Abs: 2.6 10*3/uL (ref 1.7–7.7)
Neutrophils Relative %: 57 %
Platelet Count: 255 10*3/uL (ref 150–400)
RBC: 4.17 MIL/uL (ref 3.87–5.11)
RDW: 13.3 % (ref 11.5–15.5)
WBC Count: 4.6 10*3/uL (ref 4.0–10.5)
nRBC: 0 % (ref 0.0–0.2)

## 2020-08-15 LAB — CMP (CANCER CENTER ONLY)
ALT: 22 U/L (ref 0–44)
AST: 19 U/L (ref 15–41)
Albumin: 4.8 g/dL (ref 3.5–5.0)
Alkaline Phosphatase: 62 U/L (ref 38–126)
Anion gap: 7 (ref 5–15)
BUN: 12 mg/dL (ref 6–20)
CO2: 30 mmol/L (ref 22–32)
Calcium: 10 mg/dL (ref 8.9–10.3)
Chloride: 103 mmol/L (ref 98–111)
Creatinine: 0.87 mg/dL (ref 0.44–1.00)
GFR, Estimated: >60 mL/min (ref >60)
Glucose, Bld: 101 mg/dL — ABNORMAL HIGH (ref 70–99)
Potassium: 4.6 mmol/L (ref 3.5–5.1)
Sodium: 140 mmol/L (ref 135–145)
Total Bilirubin: 0.8 mg/dL (ref 0.3–1.2)
Total Protein: 6.9 g/dL (ref 6.5–8.1)

## 2020-08-15 NOTE — Telephone Encounter (Signed)
appts made per 08/15/20 los and she  Will view on mychart and placed in her calendar    Veronica Lloyd

## 2020-08-15 NOTE — Progress Notes (Signed)
Hematology and Oncology Follow Up Visit  Veronica Lloyd [id number] 05/24/1961 59 y.o. 08/15/2020   Principle Diagnosis:  Stage I (pT1bpN0Mx) ILC of the right breast, ER/PR+, HER2-  Past Therapy: 03/2015: lumpectomy w/ SLN bx  Path: grade 1 ILC, 7mm tumor, ER 86%, PR 48%, HER2- (2+ IHC, FISH non-amplified), margins negative, 0/1 SLN  Adjuvant RT completed in 05/2015 Tamoxifene 06/2015 - 08/2016 TAH/BSO in 07/2016  Current Therapy:   08/2016 - present: Arimidex  --DC on 08/15/2020   Interim History:  Veronica Lloyd is here today for follow-up.  This is the first time that I am seeing her.  Veronica Lloyd is incredibly nice.  Veronica Lloyd is [name].  Veronica Lloyd is postmenopausal.  Veronica Lloyd has a very early stage lobular carcinoma of the right breast.  Veronica Lloyd underwent a lumpectomy back in October 2016.  Veronica Lloyd had adjuvant radiation therapy.  Veronica Lloyd then was placed on tamoxifen.  Veronica Lloyd also had a hysterectomy in February 2018.  Veronica Lloyd was placed on Arimidex.  I now will going to stop the Arimidex.  I really think that Veronica Lloyd does not need additional Arimidex.  Veronica Lloyd feels well.  Veronica Lloyd has no complaints.  Veronica Lloyd and her husband do work for the [employer].    Veronica Lloyd has had no problems with nausea or vomiting.  Veronica Lloyd had no issues with fever.  Veronica Lloyd has had no problems with the COVIDvirus.  There is been no issues with bleeding.  There is no rashes.  Veronica Lloyd has had no leg swelling.  Overall, her performance status is ECOG 0.  Medications:  Allergies as of 08/15/2020   No Known Allergies     Medication List       Accurate as of August 15, 2020  1:01 PM. If you have any questions, ask your nurse or doctor.        anastrozole 1 MG tablet Commonly known as: ARIMIDEX TAKE 1 TABLET BY MOUTH EVERY DAY   atorvastatin 80 MG tablet Commonly known as: LIPITOR Take 80 mg by mouth daily.   buPROPion 150 MG 12 hr tablet Commonly known as: ZYBAN Take 1 tablet (150 mg total) by mouth 2 (two) times daily. What changed: when to  take this   CALCIUM PO Take 500 mg by mouth daily.   clonazePAM 1 MG tablet Commonly known as: KLONOPIN TAKE 1/2-1 TABLETS (0.5-1 MG TOTAL) BY MOUTH AS NEEDED FOR ANXIETY.   COD LIVER OIL PO Take by mouth daily.   famotidine 20 MG tablet Commonly known as: PEPCID TAKE 1 TABLET TWICE A DAY FOR REFLUX. INS ONLY COVERS 30 DAY SUPPLY   gabapentin 100 MG capsule Commonly known as: NEURONTIN Take 100 mg by mouth 2 (two) times daily.   MAGNESIUM PO Take by mouth daily.   traZODone 50 MG tablet Commonly known as: DESYREL TAKE 0.5-1 TABLETS (25-50 MG TOTAL) BY MOUTH AT BEDTIME AS NEEDED FOR SLEEP.       Allergies: No Known Allergies  Past Medical History, Surgical history, Social history, and Family History were reviewed and updated.  Review of Systems: Review of Systems  Constitutional: Negative.   HENT: Negative.   Eyes: Negative.   Respiratory: Negative.   Cardiovascular: Negative.   Gastrointestinal: Negative.   Genitourinary: Negative.   Musculoskeletal: Negative.   Skin: Negative.   Neurological: Negative.   Endo/Heme/Allergies: Negative.   Psychiatric/Behavioral: Negative.       Physical Exam:  weight is 211 lb (95.7 kg). Her oral temperature is 98.3 F (36.8 C). Her   blood pressure is 142/80 (abnormal) and her pulse is 65. Her respiration is 20 and oxygen saturation is 99%.   Wt Readings from Last 3 Encounters:  08/15/20 211 lb (95.7 kg)  07/28/20 213 lb (96.6 kg)  07/18/20 213 lb (96.6 kg)    Physical Exam Vitals reviewed.  HENT:     Head: Normocephalic and atraumatic.  Eyes:     Pupils: Pupils are equal, round, and reactive to light.  Cardiovascular:     Rate and Rhythm: Normal rate and regular rhythm.     Heart sounds: Normal heart sounds.  Pulmonary:     Effort: Pulmonary effort is normal.     Breath sounds: Normal breath sounds.  Abdominal:     General: Bowel sounds are normal.     Palpations: Abdomen is soft.  Musculoskeletal:         General: No tenderness or deformity. Normal range of motion.     Cervical back: Normal range of motion.  Lymphadenopathy:     Cervical: No cervical adenopathy.  Skin:    General: Skin is warm and dry.     Findings: No erythema or rash.  Neurological:     Mental Status: Veronica Lloyd is alert and oriented to person, place, and time.  Psychiatric:        Behavior: Behavior normal.        Thought Content: Thought content normal.        Judgment: Judgment normal.      Lab Results  Component Value Date   WBC 4.6 08/15/2020   HGB 12.9 08/15/2020   HCT 39.0 08/15/2020   MCV 93.5 08/15/2020   PLT 255 08/15/2020   No results found for: FERRITIN, IRON, TIBC, UIBC, IRONPCTSAT Lab Results  Component Value Date   RBC 4.17 08/15/2020   No results found for: KPAFRELGTCHN, LAMBDASER, KAPLAMBRATIO No results found for: IGGSERUM, IGA, IGMSERUM No results found for: Odetta Pink, SPEI   Chemistry      Component Value Date/Time   NA 140 08/15/2020 1204   K 4.6 08/15/2020 1204   CL 103 08/15/2020 1204   CO2 30 08/15/2020 1204   BUN 12 08/15/2020 1204   CREATININE 0.87 08/15/2020 1204   CREATININE 0.82 02/27/2019 1625      Component Value Date/Time   CALCIUM 10.0 08/15/2020 1204   ALKPHOS 62 08/15/2020 1204   AST 19 08/15/2020 1204   ALT 22 08/15/2020 1204   BILITOT 0.8 08/15/2020 1204       Impression and Plan: [name] is a very pleasant 60 yo [place] female with history of stage I (pT1bpN0Mx) ILC of the right breast, ER/PR+, HER2-. Veronica Lloyd has done very well with treatment and so far there has been no evidence of recurrence.   At this point time, we will go ahead and stop the Arimidex.  I really do not think Veronica Lloyd needs any additional Arimidex.  I really believe that her risk of recurrence is easily go to be less than 10%.  We will plan for another follow-up in 6 months.  I would like to still follow her along every 6  months.   Volanda Napoleon, MD 3/14/20221:01 PM

## 2020-09-12 ENCOUNTER — Ambulatory Visit: Payer: BC Managed Care – PPO | Admitting: Family Medicine

## 2020-10-10 ENCOUNTER — Other Ambulatory Visit: Payer: Self-pay | Admitting: Family Medicine

## 2020-11-18 ENCOUNTER — Other Ambulatory Visit: Payer: Self-pay

## 2020-11-18 MED ORDER — FAMOTIDINE 20 MG PO TABS: ORAL_TABLET | ORAL | 2 refills | Status: DC

## 2020-12-14 ENCOUNTER — Other Ambulatory Visit: Payer: Self-pay

## 2020-12-14 ENCOUNTER — Encounter: Payer: Self-pay | Admitting: Dermatology

## 2020-12-14 ENCOUNTER — Ambulatory Visit (INDEPENDENT_AMBULATORY_CARE_PROVIDER_SITE_OTHER): Payer: BC Managed Care – PPO | Admitting: Dermatology

## 2020-12-14 DIAGNOSIS — D2371 Other benign neoplasm of skin of right lower limb, including hip: Secondary | ICD-10-CM

## 2020-12-14 DIAGNOSIS — Z1283 Encounter for screening for malignant neoplasm of skin: Secondary | ICD-10-CM | POA: Diagnosis not present

## 2020-12-14 DIAGNOSIS — D2372 Other benign neoplasm of skin of left lower limb, including hip: Secondary | ICD-10-CM

## 2020-12-14 DIAGNOSIS — L821 Other seborrheic keratosis: Secondary | ICD-10-CM

## 2020-12-14 DIAGNOSIS — D239 Other benign neoplasm of skin, unspecified: Secondary | ICD-10-CM

## 2020-12-14 DIAGNOSIS — D1800 Hemangioma unspecified site: Secondary | ICD-10-CM | POA: Diagnosis not present

## 2020-12-14 DIAGNOSIS — D2339 Other benign neoplasm of skin of other parts of face: Secondary | ICD-10-CM | POA: Diagnosis not present

## 2020-12-19 IMAGING — MG DIGITAL DIAGNOSTIC BILAT W/ TOMO W/ CAD
8 of 13 series · 8 of 37 positions shown · non-contrast
Comparison: Previous exam(s).

CLINICAL DATA: Personal history of right breast cancer status post
lumpectomy 9025. Patient also has history of benign biopsy left
breast 4448.

EXAM:
DIGITAL DIAGNOSTIC BILATERAL MAMMOGRAM WITH CAD AND TOMO

[R MLO]
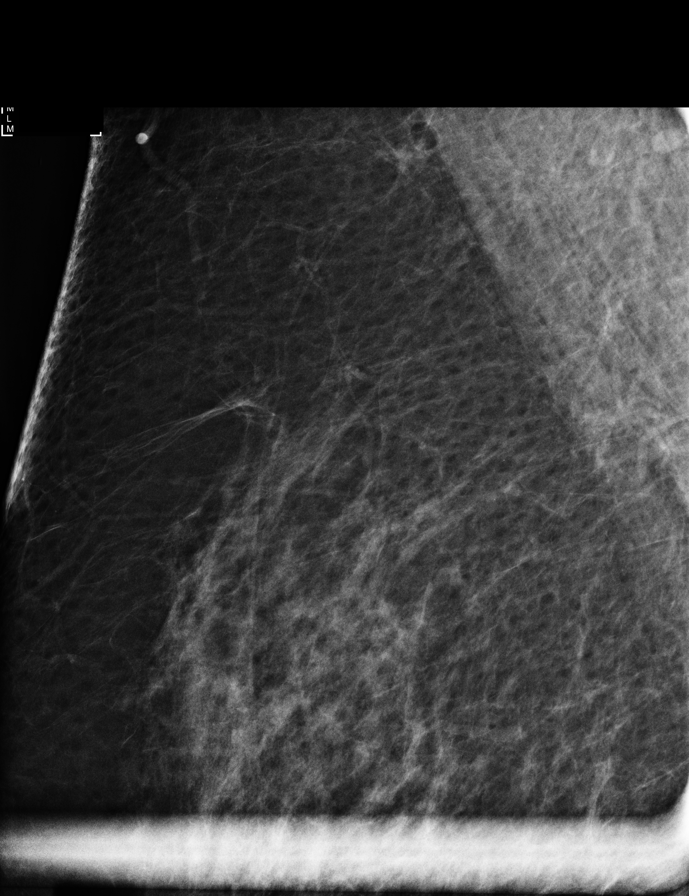

[L MLO synth-2D (1 of 2)]
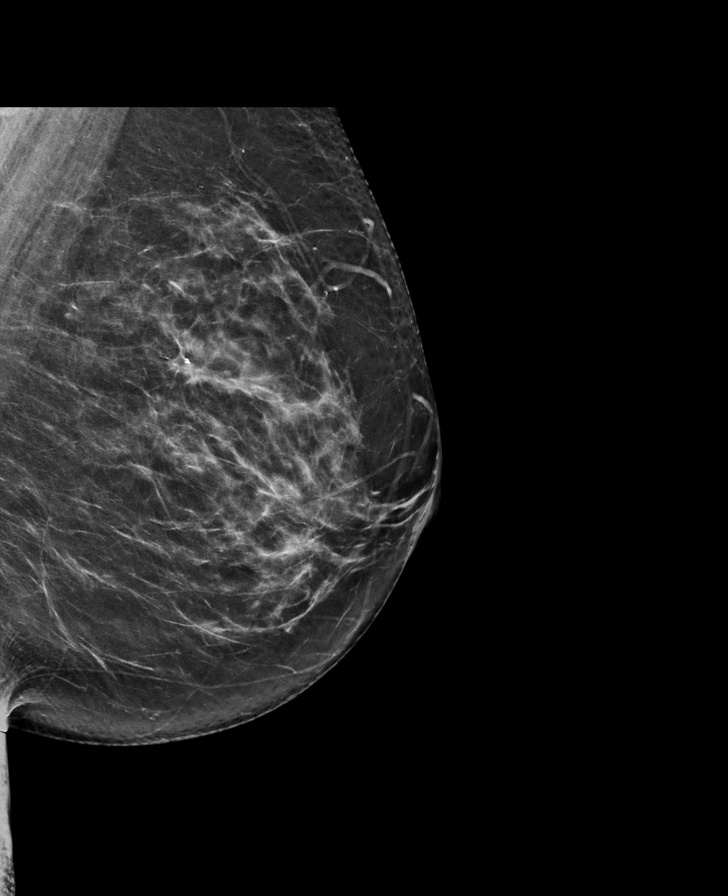

[L CC synth-2D (1 of 2)]
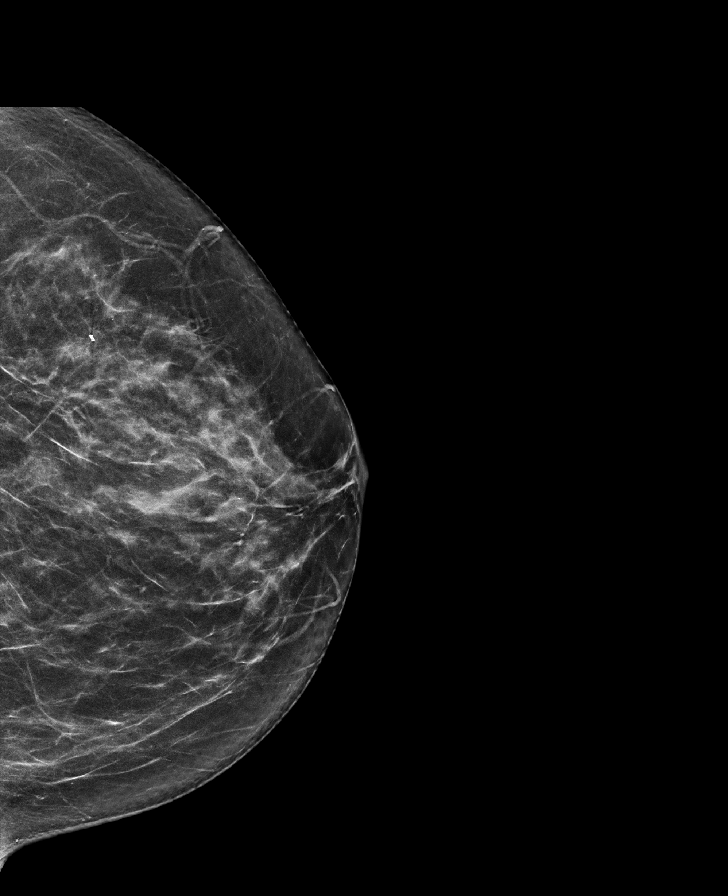

[L CC synth-2D (2 of 2)]
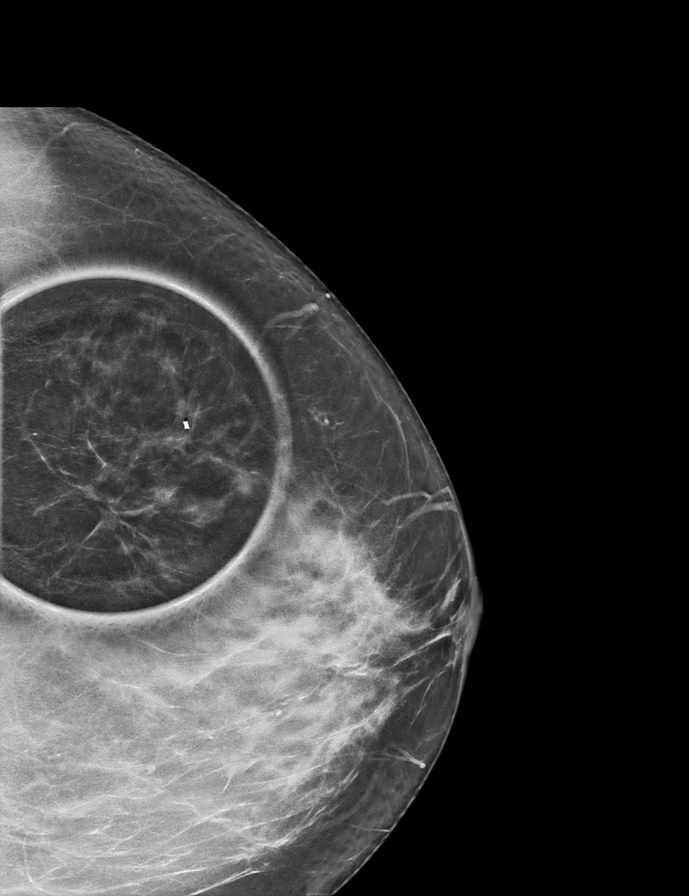

[L MLO synth-2D (2 of 2)]
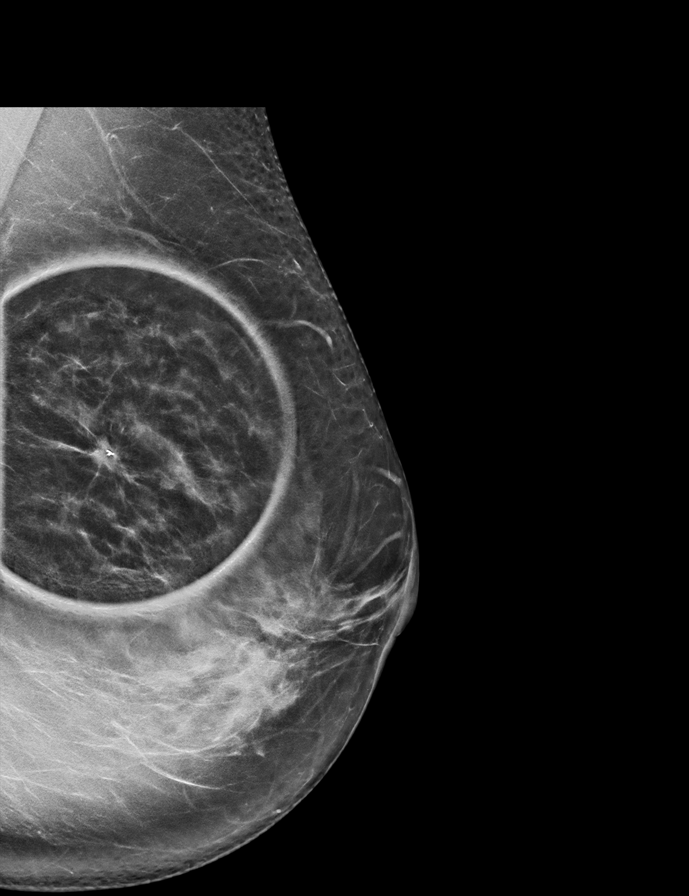

[R MLO synth-2D]
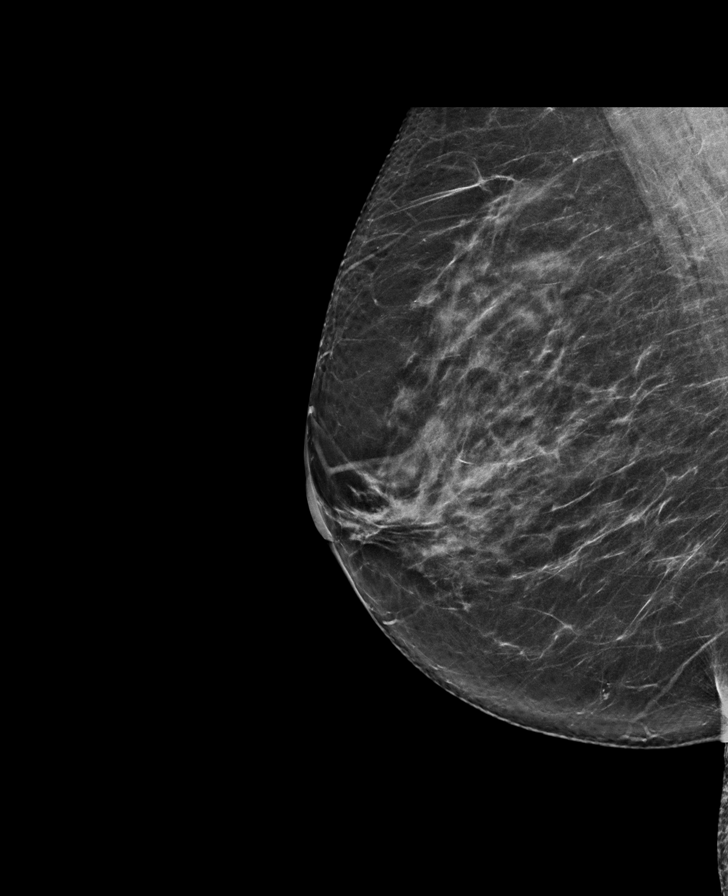

[R CC synth-2D]
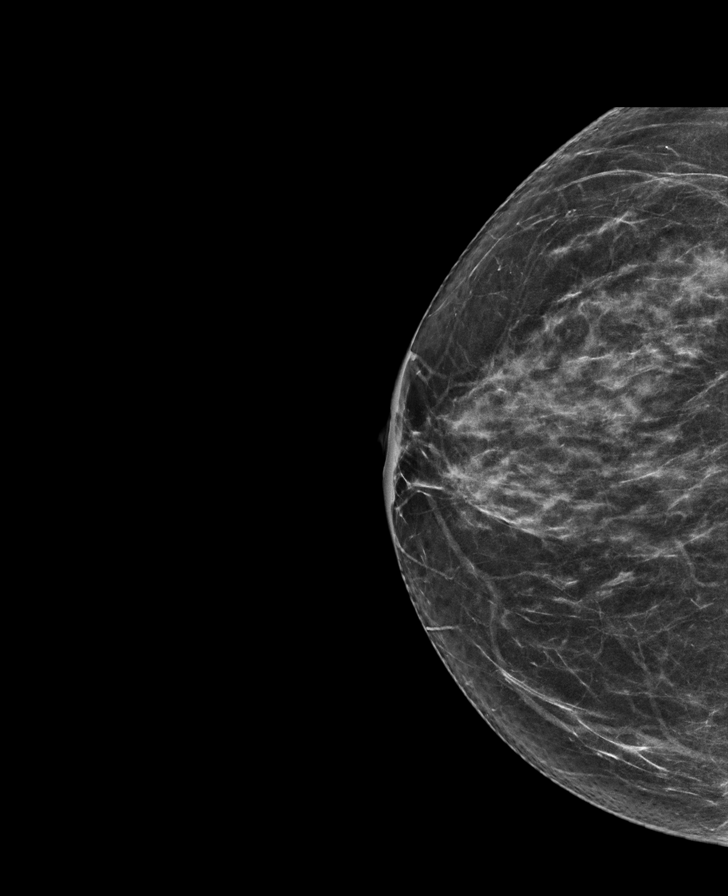

[L MLO tomo · tomo slice 37/72.0]
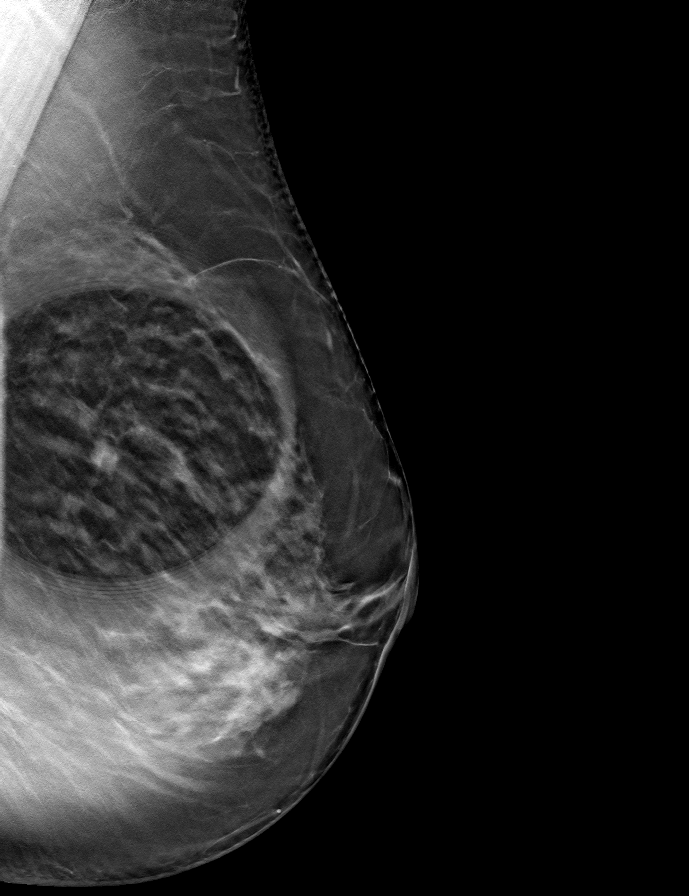

[8 of 37 positions shown; findings below may reference images not displayed]

ACR Breast Density Category c: The breast tissue is heterogeneously
dense, which may obscure small masses.
FINDINGS: Cc and MLO views of bilateral breasts, spot compression CC and MLO
views of left are submitted. Stable postsurgical and post biopsy
changes identified both breasts. No suspicious identified.

Mammographic images were processed with CAD.
IMPRESSION: Benign findings.

RECOMMENDATION:
Bilateral diagnostic mammogram in 1 year.

I have discussed the findings and recommendations with the patient.
If applicable, a reminder letter will be sent to the patient
regarding the next appointment.

BI-RADS CATEGORY  2: Benign.

## 2020-12-25 ENCOUNTER — Encounter: Payer: Self-pay | Admitting: Dermatology

## 2020-12-25 NOTE — Progress Notes (Signed)
   New Patient   Subjective  Veronica Lloyd is a 60 y.o. female who presents for the following: New Patient (Initial Visit) (Patient here today for skin check, no concerns. No personal history of atypical moles, melanoma or non mole skin cancer. Per patient he father did have non mole skin cancer. ).  Annual skin examination, several specific places she would like checked. Location:  Duration:  Quality:  Associated Signs/Symptoms: Modifying Factors:  Severity:  Timing: Context:    The following portions of the chart were reviewed this encounter and updated as appropriate:  Tobacco  Allergies  Meds  Problems  Med Hx  Surg Hx  Fam Hx      Objective  Well appearing patient in no apparent distress; mood and affect are within normal limits. Generalized, Left Inguinal Area No atypical pigmented lesions or nonmelanoma skin cancer  Right Abdomen (side) - Lower Smooth red 1 to 3 mm dermal papules  Left Malar Cheek, Right Malar Cheek Subtle 1 to 90m dermal papules.  Clinically benign but not diagnostic of syringomas.  Left Lower Leg - Anterior, Right Lower Leg - Anterior Firm 5 mm pink dermal papules  Right Knee - Anterior Verrucous tan 4 mm papule, SK versus wart    A full examination was performed including scalp, head, eyes, ears, nose, lips, neck, chest, axillae, abdomen, back, buttocks, bilateral upper extremities, bilateral lower extremities, hands, feet, fingers, toes, fingernails, and toenails. All findings within normal limits unless otherwise noted below.  Areas beneath undergarments not fully examined.   Assessment & Plan  Encounter for screening for malignant neoplasm of skin (2) Left Inguinal Area; Generalized  Annual skin examination, encouraged to self examine twice annually.  Continue ultraviolet protection.  Hemangioma, unspecified site Right Abdomen (side) - Lower  No intervention necessary  Syringoma (2) Left Malar Cheek; Right Malar  Cheek  Leave stable  Dermatofibroma (2) Left Lower Leg - Anterior; Right Lower Leg - Anterior  Leave if stable  Seborrheic keratosis Right Knee - Anterior  Leave if stable.

## 2021-01-11 ENCOUNTER — Other Ambulatory Visit: Payer: Self-pay | Admitting: Family Medicine

## 2021-02-02 ENCOUNTER — Other Ambulatory Visit: Payer: Self-pay | Admitting: Family Medicine

## 2021-02-02 DIAGNOSIS — F418 Other specified anxiety disorders: Secondary | ICD-10-CM

## 2021-02-10 ENCOUNTER — Other Ambulatory Visit: Payer: Self-pay | Admitting: Family Medicine

## 2021-02-15 ENCOUNTER — Inpatient Hospital Stay: Payer: BC Managed Care – PPO | Admitting: Hematology & Oncology

## 2021-02-15 ENCOUNTER — Inpatient Hospital Stay: Payer: BC Managed Care – PPO

## 2021-02-16 ENCOUNTER — Other Ambulatory Visit: Payer: Self-pay | Admitting: Family Medicine

## 2021-02-16 DIAGNOSIS — F418 Other specified anxiety disorders: Secondary | ICD-10-CM

## 2021-02-17 ENCOUNTER — Other Ambulatory Visit: Payer: Self-pay

## 2021-02-17 ENCOUNTER — Encounter: Payer: Self-pay | Admitting: Family Medicine

## 2021-02-17 ENCOUNTER — Ambulatory Visit (INDEPENDENT_AMBULATORY_CARE_PROVIDER_SITE_OTHER): Payer: BC Managed Care – PPO | Admitting: Family Medicine

## 2021-02-17 VITALS — BP 112/68 | HR 64 | Temp 98.2°F | Ht 65.0 in | Wt 216.2 lb

## 2021-02-17 DIAGNOSIS — E785 Hyperlipidemia, unspecified: Secondary | ICD-10-CM

## 2021-02-17 DIAGNOSIS — E66812 Obesity, class 2: Secondary | ICD-10-CM

## 2021-02-17 DIAGNOSIS — I700 Atherosclerosis of aorta: Secondary | ICD-10-CM

## 2021-02-17 DIAGNOSIS — K219 Gastro-esophageal reflux disease without esophagitis: Secondary | ICD-10-CM

## 2021-02-17 DIAGNOSIS — F418 Other specified anxiety disorders: Secondary | ICD-10-CM | POA: Diagnosis not present

## 2021-02-17 DIAGNOSIS — Z6835 Body mass index (BMI) 35.0-35.9, adult: Secondary | ICD-10-CM

## 2021-02-17 LAB — COMPREHENSIVE METABOLIC PANEL
AG Ratio: 1.8 (calc) (ref 1.0–2.5)
ALT: 26 U/L (ref 6–29)
AST: 25 U/L (ref 10–35)
Albumin: 4.6 g/dL (ref 3.6–5.1)
Alkaline phosphatase (APISO): 70 U/L (ref 37–153)
BUN: 10 mg/dL (ref 7–25)
CO2: 27 mmol/L (ref 20–32)
Calcium: 9.7 mg/dL (ref 8.6–10.4)
Chloride: 103 mmol/L (ref 98–110)
Creat: 0.85 mg/dL (ref 0.50–1.05)
Globulin: 2.5 g/dL (calc) (ref 1.9–3.7)
Glucose, Bld: 102 mg/dL — ABNORMAL HIGH (ref 65–99)
Potassium: 4.6 mmol/L (ref 3.5–5.3)
Sodium: 139 mmol/L (ref 135–146)
Total Bilirubin: 0.7 mg/dL (ref 0.2–1.2)
Total Protein: 7.1 g/dL (ref 6.1–8.1)

## 2021-02-17 LAB — LIPID PANEL
Cholesterol: 211 mg/dL — ABNORMAL HIGH (ref <200)
HDL: 80 mg/dL (ref >=50)
LDL Cholesterol (Calc): 102 mg/dL (calc) — ABNORMAL HIGH
Non-HDL Cholesterol (Calc): 131 mg/dL (calc) — ABNORMAL HIGH (ref <130)
Total CHOL/HDL Ratio: 2.6 (calc) (ref <5.0)
Triglycerides: 176 mg/dL — ABNORMAL HIGH (ref <150)

## 2021-02-17 MED ORDER — OZEMPIC (0.25 OR 0.5 MG/DOSE) 2 MG/1.5ML ~~LOC~~ SOPN: PEN_INJECTOR | SUBCUTANEOUS | 1 refills | Status: DC

## 2021-02-17 MED ORDER — FAMOTIDINE 20 MG PO TABS: ORAL_TABLET | ORAL | 2 refills | Status: DC

## 2021-02-17 NOTE — Patient Instructions (Addendum)
Keep the diet clean and stay active.  Give [place] 2-3 business days to get the results of your labs back.   If you do not hear anything about your referral in the next 1-2 weeks, call our office and ask for an update.  I recommend getting the flu shot in mid October. This suggestion would change if the CDC comes out with a different recommendation.   Let us know if you need anything.

## 2021-02-17 NOTE — Progress Notes (Signed)
Chief Complaint  Patient presents with   Follow-up    Subjective: Hyperlipidemia Patient presents for Hyperlipidemia follow up. Currently taking Lipitor 80 mg/d and compliance with treatment thus far has been good. She denies myalgias. She is adhering to a healthy diet. Exercise: walking, yoga, some HIIT The patient is not known to have coexisting coronary artery disease.  Anxiety Takes Klonopin 0.5-1 mg written for bid prn, takes around 3 times weekly when she has to speak in front of people.  No adverse effects.  She is no longer taking a daily medication.  She is not currently following with a counselor or psychologist.  Obesity w severe features (hyperlipidemia, reflux).  Patient has been diligent with routine exercise and overall healthy eating.  She seems to be gaining weight.  She has failed phentermine and bupropion in the past.  She is interested in bariatric surgery or possibly medication.  Past Medical History:  Diagnosis Date   Anxiety    Asthma    Cancer ([place]) 2016   Breast   Hyperlipidemia    Personal history of radiation therapy     Objective: BP 112/68   Pulse 64   Temp 98.2 F (36.8 C) (Oral)   Ht '5\' 5"'$  (1.651 m)   Wt 216 lb 4 oz (98.1 kg)   SpO2 98%   BMI 35.99 kg/m  General: Awake, appears stated age HEENT: MMM Heart: RRR, no LE edema, no bruits Lungs: CTAB, no rales, wheezes or rhonchi. No accessory muscle use Psych: Age appropriate judgment and insight, normal affect and mood  Assessment and Plan: Hyperlipidemia, unspecified hyperlipidemia type - Plan: Comprehensive metabolic panel, Lipid panel  Situational anxiety  Aortic calcification (HCC)  Class 2 severe obesity due to excess calories with serious comorbidity and body mass index (BMI) of 35.0 to 35.9 in adult [facility]) - Plan: Semaglutide,0.25 or 0.'5MG'$ /DOS, (OZEMPIC, 0.25 OR 0.5 MG/DOSE,) 2 MG/1.5ML SOPN, Amb Referral to Bariatric Surgery  Gastroesophageal reflux disease, unspecified whether  esophagitis present  Chronic, stable.  Check labs.  Continue Lipitor 80 mg daily.  Counseled on diet and exercise. Chronic, stable.  Continue Klonopin 0.5-1 mg twice daily as needed. Chronic, stable, as addressed as #1. Chronic, unstable.  Refer to bariatric surgery team.  Trial semaglutide 0.25 mg weekly for 4 doses and then increase to 0.5 mg weekly.  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  Warned about cost with this medication. F/u in 6 mo or prn. The patient voiced understanding and agreement to the plan.  [place], DO 02/17/21  2:24 PM

## 2021-02-21 ENCOUNTER — Telehealth: Payer: Self-pay | Admitting: *Deleted

## 2021-02-21 NOTE — Telephone Encounter (Signed)
Prior auth started via cover my meds.  Awaiting determination.  Key: [id number]

## 2021-02-24 NOTE — Telephone Encounter (Signed)
Ozempic denied.  They will only cover if patient has diabetes.  Not sure if you would like to try anything else?

## 2021-03-07 ENCOUNTER — Other Ambulatory Visit: Payer: Self-pay | Admitting: Family Medicine

## 2021-03-07 DIAGNOSIS — Z1231 Encounter for screening mammogram for malignant neoplasm of breast: Secondary | ICD-10-CM

## 2021-03-13 ENCOUNTER — Ambulatory Visit (INDEPENDENT_AMBULATORY_CARE_PROVIDER_SITE_OTHER): Payer: BC Managed Care – PPO

## 2021-03-13 ENCOUNTER — Ambulatory Visit (INDEPENDENT_AMBULATORY_CARE_PROVIDER_SITE_OTHER): Payer: BC Managed Care – PPO | Admitting: Podiatry

## 2021-03-13 ENCOUNTER — Other Ambulatory Visit: Payer: Self-pay

## 2021-03-13 DIAGNOSIS — M2012 Hallux valgus (acquired), left foot: Secondary | ICD-10-CM

## 2021-03-26 NOTE — Progress Notes (Signed)
   Subjective: 60 y.o. female presents today as a new patient for evaluation of a symptomatic bunion that is been going on to her left foot for several years.  She states that the bunion has especially gotten worse over the past 1-2 years.  She says all of her shoes are uncomfortable despite shoe gear modifications.  She had seen commercials of the [place] plasty procedure and would like to discuss surgery.  She presents for further treatment and evaluation   Past Medical History:  Diagnosis Date   Anxiety    Asthma    Cancer ([place]) 2016   Breast   Hyperlipidemia    Personal history of radiation therapy       Objective: Physical Exam General: The patient is alert and oriented x3 in no acute distress.  Dermatology: Skin is cool, dry and supple bilateral lower extremities. Negative for open lesions or macerations.  Vascular: Palpable pedal pulses bilaterally. No edema or erythema noted. Capillary refill within normal limits.  Neurological: Epicritic and protective threshold grossly intact bilaterally.   Musculoskeletal Exam: Clinical evidence of bunion deformity noted to the respective foot. There is moderate pain on palpation range of motion of the first MPJ. Lateral deviation of the hallux noted consistent with hallux abductovalgus.  Radiographic Exam: Increased intermetatarsal angle greater than 15 with a hallux abductus angle greater than 30 noted on AP view. Moderate degenerative changes noted within the first MPJ.  Assessment: 1. HAV w/ bunion deformity left   Plan of Care:  1. Patient was evaluated. X-Rays reviewed. 2. Today we discussed the conservative versus surgical management of the presenting pathology. The patient opts for surgical management. All possible complications and details of the procedure were explained. All patient questions were answered. No guarantees were expressed or implied. 3. Authorization for surgery was initiated today. Surgery will consist of  Lapidus bunionectomy left 4.  Return to clinic 1 week postop  *Works @[employer].  Moves every 3-4 years.  We will find out if she is moving in April    May, DPM [facility]  Dr. Edrick Kins, [place]                                        [place], [place]                Office [phone]  Fax [phone]

## 2021-04-06 ENCOUNTER — Other Ambulatory Visit: Payer: Self-pay | Admitting: Family Medicine

## 2021-04-06 MED ORDER — QSYMIA 3.75-23 MG PO CP24: 1.0000 | ORAL_CAPSULE | Freq: Every day | ORAL | 0 refills | Status: DC

## 2021-04-10 ENCOUNTER — Other Ambulatory Visit: Payer: Self-pay | Admitting: Family Medicine

## 2021-04-10 ENCOUNTER — Telehealth: Payer: Self-pay | Admitting: Family Medicine

## 2021-04-10 MED ORDER — QSYMIA 3.75-23 MG PO CP24: 1.0000 | ORAL_CAPSULE | Freq: Every day | ORAL | 0 refills | Status: DC

## 2021-04-10 NOTE — Telephone Encounter (Signed)
The patient called back and gave her the number to [facility]

## 2021-04-10 NOTE — Telephone Encounter (Signed)
I'm not sure I gave her anything other than our [facility] or bariatric surgery team? OK to print last avs info though. Ty.

## 2021-04-10 NOTE — Telephone Encounter (Signed)
Patient would like the list of weight clinics that was given to her on her last OV sent to her MyChart. She states she lost the list and would please like a copy. Please advice.

## 2021-04-10 NOTE — Telephone Encounter (Signed)
Called left message to call back 

## 2021-04-20 NOTE — Telephone Encounter (Signed)
Pt. Stated PA needs to be submitted for medication and she hasn't heard anything back regarding the process. Please advise

## 2021-04-21 ENCOUNTER — Other Ambulatory Visit: Payer: Self-pay | Admitting: Family Medicine

## 2021-04-21 MED ORDER — TOPIRAMATE 25 MG PO TABS: ORAL_TABLET | ORAL | 1 refills | Status: DC

## 2021-04-21 MED ORDER — PHENTERMINE HCL 37.5 MG PO CAPS: 37.5000 mg | ORAL_CAPSULE | ORAL | 0 refills | Status: DC

## 2021-04-24 ENCOUNTER — Other Ambulatory Visit: Payer: Self-pay

## 2021-04-24 ENCOUNTER — Ambulatory Visit
Admission: RE | Admit: 2021-04-24 | Discharge: 2021-04-24 | Disposition: A | Discharge status: Home / Self Care | Payer: BC Managed Care – PPO | Source: Ambulatory Visit | Attending: Family Medicine | Admitting: Family Medicine

## 2021-04-24 DIAGNOSIS — Z1231 Encounter for screening mammogram for malignant neoplasm of breast: Secondary | ICD-10-CM

## 2021-04-25 ENCOUNTER — Encounter: Payer: BC Managed Care – PPO | Admitting: Obstetrics & Gynecology

## 2021-04-25 ENCOUNTER — Ambulatory Visit
Admission: RE | Admit: 2021-04-25 | Discharge: 2021-04-25 | Disposition: A | Discharge status: Home / Self Care | Payer: Self-pay | Source: Ambulatory Visit

## 2021-04-25 DIAGNOSIS — Z9289 Personal history of other medical treatment: Secondary | ICD-10-CM

## 2021-05-10 ENCOUNTER — Ambulatory Visit: Payer: BC Managed Care – PPO | Admitting: Family Medicine

## 2021-05-10 ENCOUNTER — Encounter: Payer: BC Managed Care – PPO | Admitting: Podiatry

## 2021-05-15 ENCOUNTER — Ambulatory Visit: Payer: BC Managed Care – PPO | Admitting: Family Medicine

## 2021-05-17 ENCOUNTER — Encounter: Payer: BC Managed Care – PPO | Admitting: Podiatry

## 2021-05-20 ENCOUNTER — Encounter: Payer: Self-pay | Admitting: Family Medicine

## 2021-05-31 ENCOUNTER — Encounter: Payer: BC Managed Care – PPO | Admitting: Podiatry

## 2021-06-12 IMAGING — DX DG CHEST 2V
2 series · 2 of 2 positions shown · non-contrast
Comparison: None.

CLINICAL DATA: Continued cough, chest congestion and tightness in
her throat since WHUHV-P2 infection in April 2019.

EXAM:
CHEST - 2 VIEW

[chest pa]
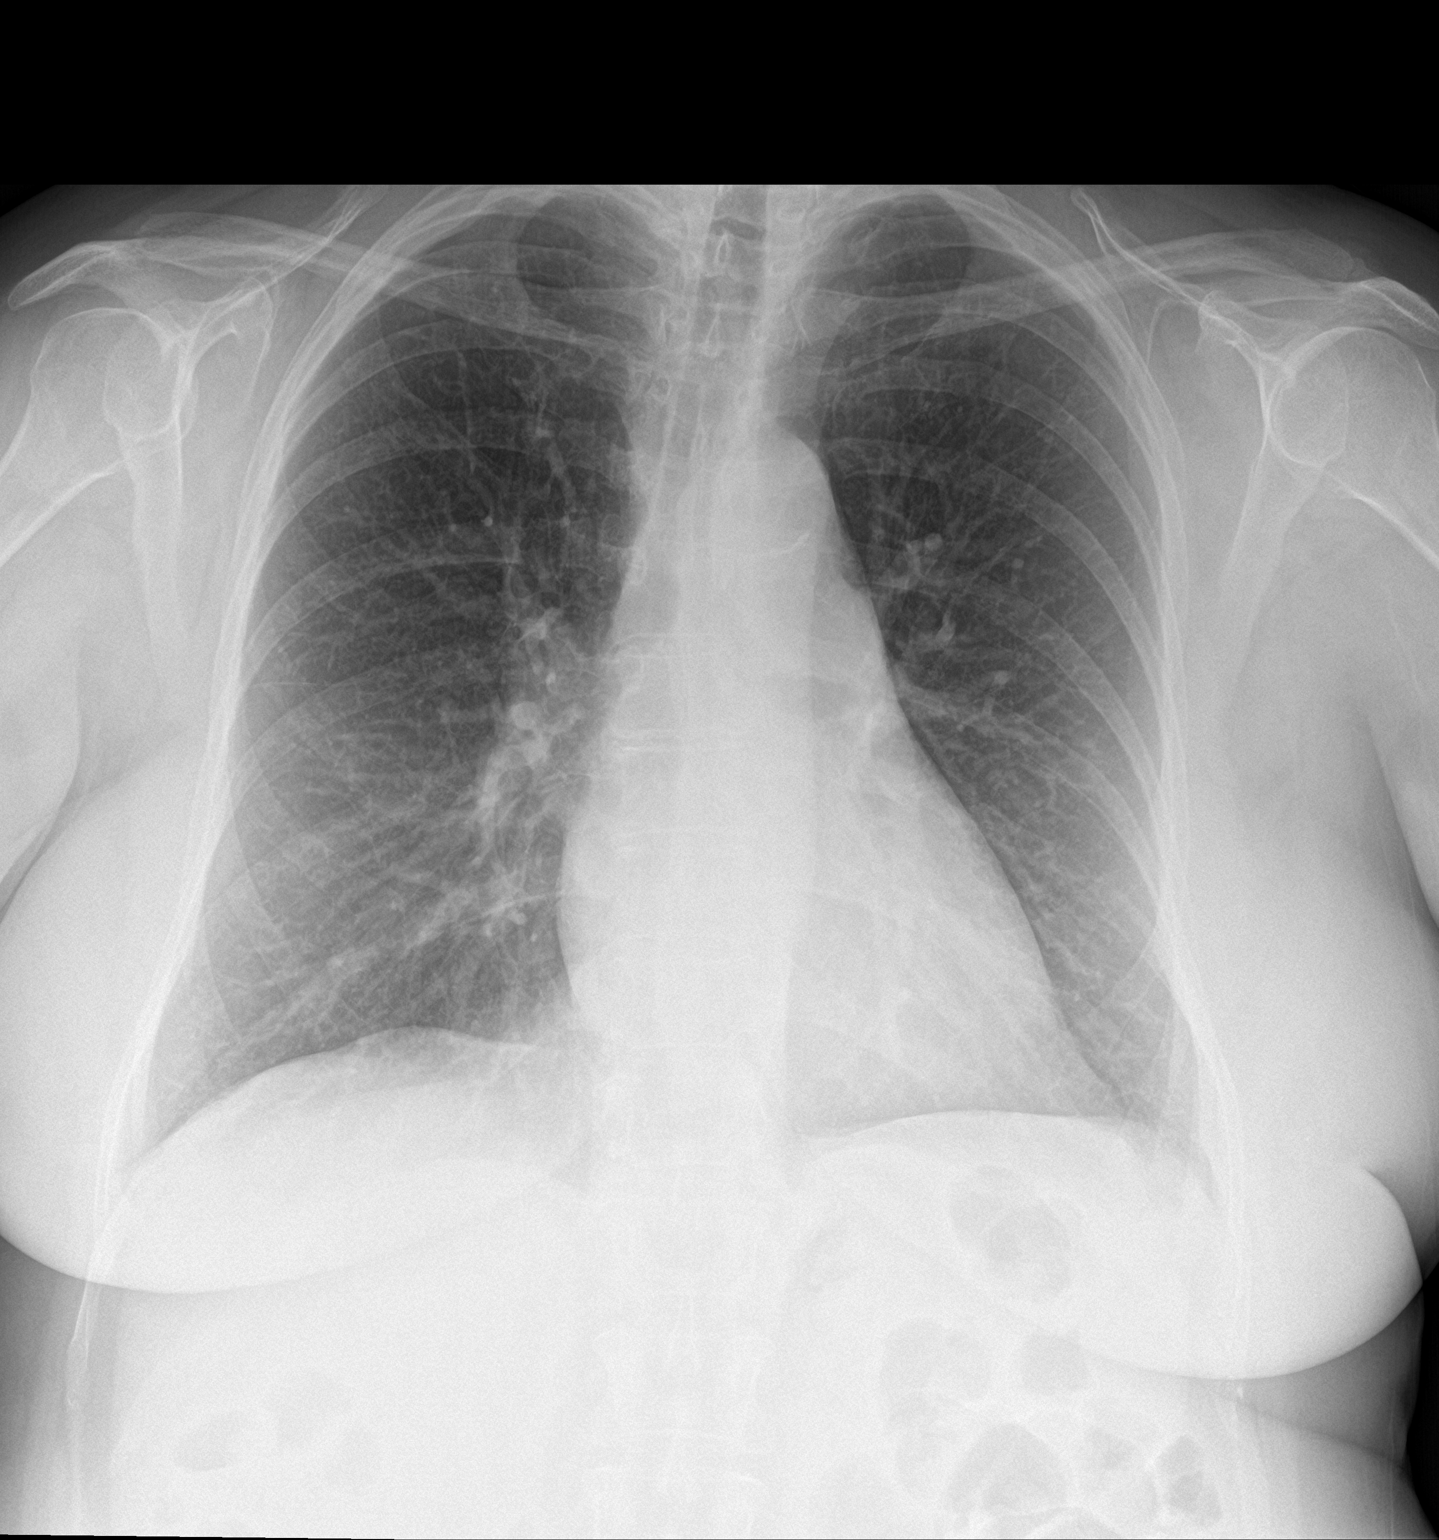

[chest lat]
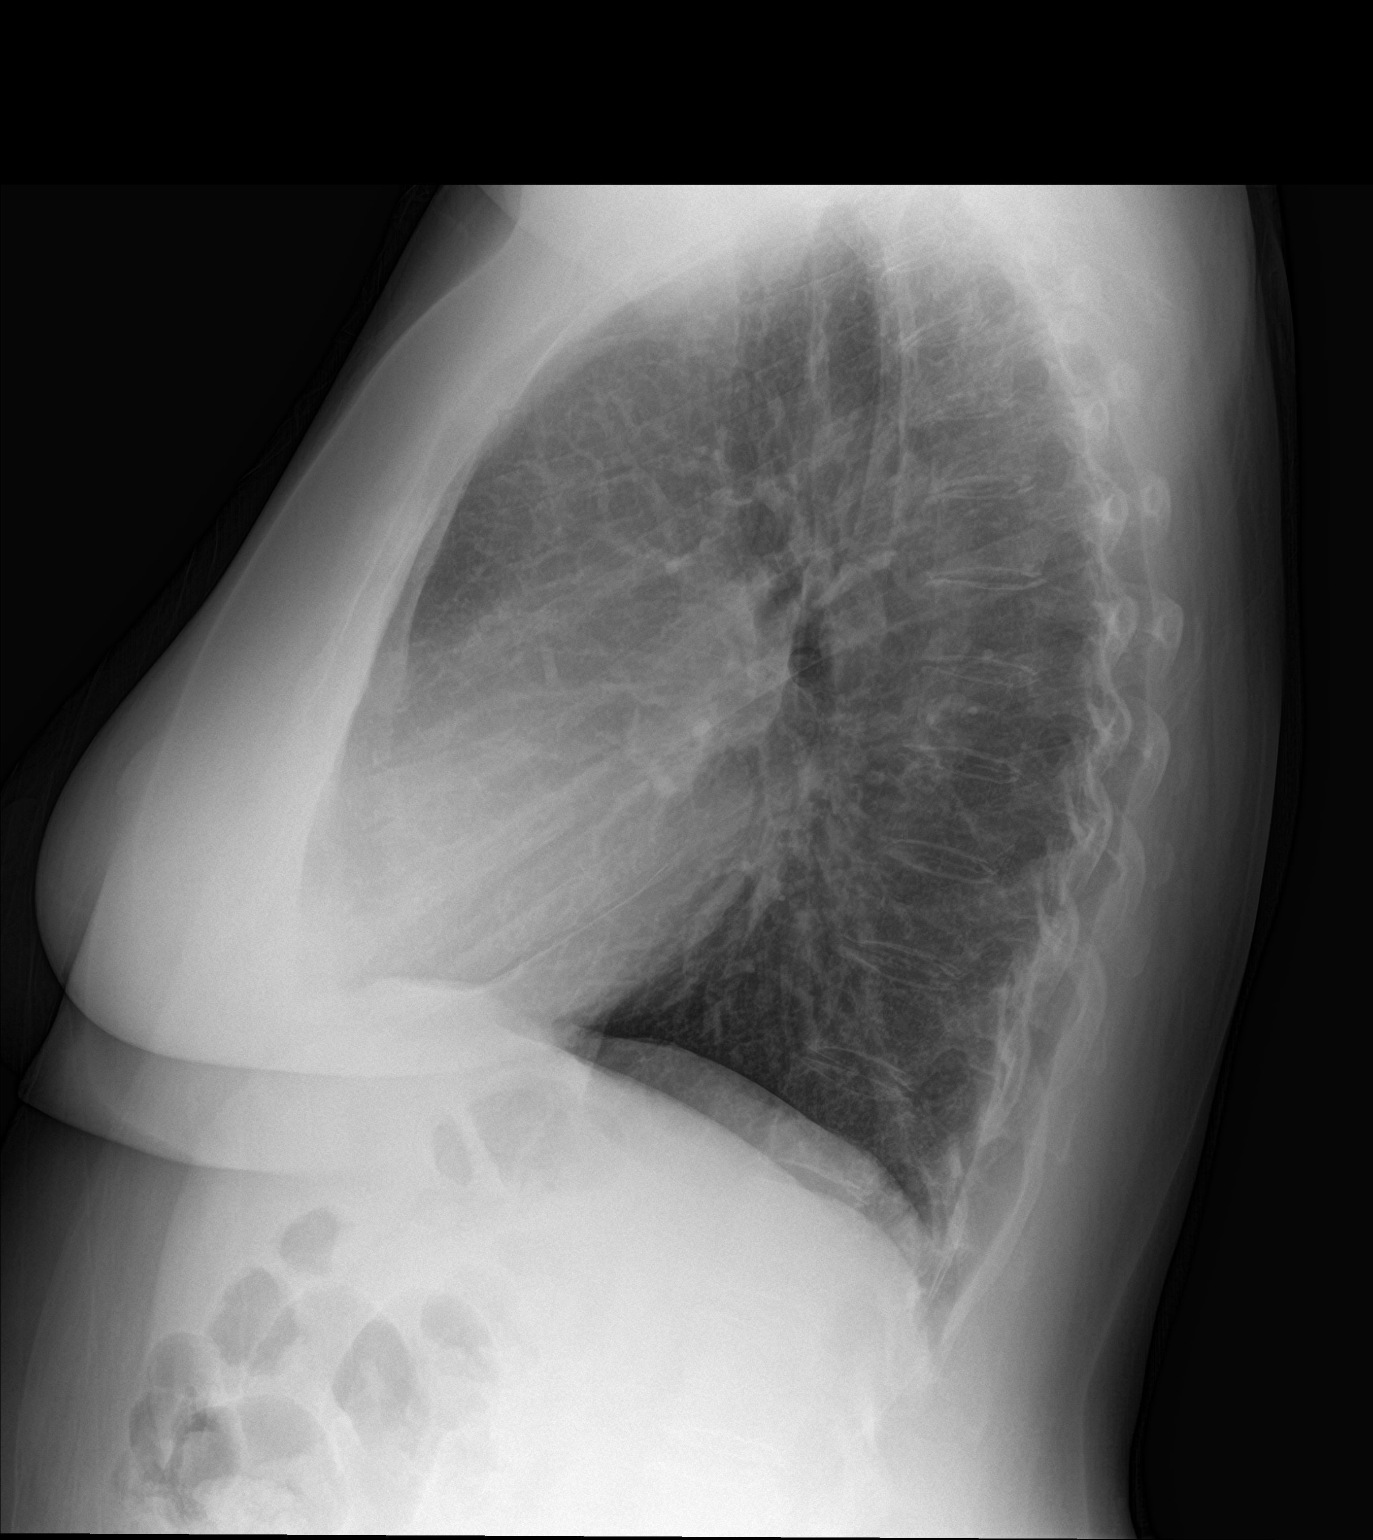

[2 of 2 positions shown; findings below may reference images not displayed]

FINDINGS: Normal sized heart. Mildly tortuous and calcified thoracic aorta.
Clear lungs with normal vascularity. Minimal diffuse peribronchial
thickening. Unremarkable bones.
IMPRESSION: Minimal bronchitic changes.

## 2021-06-29 ENCOUNTER — Other Ambulatory Visit: Payer: Self-pay | Admitting: Family Medicine

## 2021-07-01 ENCOUNTER — Other Ambulatory Visit: Payer: Self-pay | Admitting: Family Medicine

## 2021-07-01 DIAGNOSIS — F418 Other specified anxiety disorders: Secondary | ICD-10-CM

## 2021-07-03 ENCOUNTER — Other Ambulatory Visit: Payer: Self-pay | Admitting: Family Medicine

## 2021-07-03 DIAGNOSIS — F418 Other specified anxiety disorders: Secondary | ICD-10-CM

## 2021-07-03 NOTE — Telephone Encounter (Signed)
Last OV--02/17/2021 Last RF--#30 with 2 refills on 02/02/2021

## 2021-07-03 NOTE — Telephone Encounter (Signed)
Last OV--02/17/2021 Last RF--02/16/2021--#30 with 3 refills [facility]--03/14/2021 UDS--08/28/2019

## 2021-07-17 ENCOUNTER — Telehealth: Payer: Self-pay | Admitting: Urology

## 2021-07-17 NOTE — Telephone Encounter (Signed)
DOS - 08/03/21  LAPIDUS PROC. INC. BUNIONECTOMY LEFT --- [id number] POSS AIKEN OSTEOTOMY LEFT --- [id number]  [employer]BS EFFECTIVE DATE - 06/04/16   RECEIVED CALL FROM [facility] WITH [employer] THAT CPT CODES 79150 AND 56979 HAS BEEN APPROVED, AUTH # [id number], GOOD FROM 08/03/21 - 08/03/21.

## 2021-07-22 ENCOUNTER — Other Ambulatory Visit: Payer: Self-pay | Admitting: Family Medicine

## 2021-08-03 ENCOUNTER — Telehealth: Payer: Self-pay | Admitting: *Deleted

## 2021-08-03 ENCOUNTER — Other Ambulatory Visit: Payer: Self-pay | Admitting: Podiatry

## 2021-08-03 ENCOUNTER — Encounter: Payer: Self-pay | Admitting: Podiatry

## 2021-08-03 DIAGNOSIS — M2012 Hallux valgus (acquired), left foot: Secondary | ICD-10-CM | POA: Diagnosis not present

## 2021-08-03 DIAGNOSIS — M25572 Pain in left ankle and joints of left foot: Secondary | ICD-10-CM | POA: Diagnosis not present

## 2021-08-03 DIAGNOSIS — M2042 Other hammer toe(s) (acquired), left foot: Secondary | ICD-10-CM | POA: Diagnosis not present

## 2021-08-03 DIAGNOSIS — M21612 Bunion of left foot: Secondary | ICD-10-CM | POA: Diagnosis not present

## 2021-08-03 MED ORDER — IBUPROFEN 800 MG PO TABS: 800.0000 mg | ORAL_TABLET | Freq: Three times a day (TID) | ORAL | 1 refills | Status: DC

## 2021-08-03 MED ORDER — OXYCODONE-ACETAMINOPHEN 5-325 MG PO TABS: 1.0000 | ORAL_TABLET | ORAL | 0 refills | Status: DC | PRN

## 2021-08-03 NOTE — Progress Notes (Signed)
PRN postop 

## 2021-08-03 NOTE — Telephone Encounter (Signed)
Patient is calling for advise on how long to elevate and how long will she be not weight bearing, not listed on surgical sheet given at end of surgery.  ?Returned the call back to patient to go over post op surgery post op instructions, no answer, left vmessage.Please advise. ?

## 2021-08-04 ENCOUNTER — Encounter: Payer: Self-pay | Admitting: Podiatry

## 2021-08-04 ENCOUNTER — Other Ambulatory Visit: Payer: Self-pay | Admitting: Podiatry

## 2021-08-04 DIAGNOSIS — M2012 Hallux valgus (acquired), left foot: Secondary | ICD-10-CM

## 2021-08-04 DIAGNOSIS — Z9889 Other specified postprocedural states: Secondary | ICD-10-CM

## 2021-08-04 NOTE — Telephone Encounter (Signed)
Patient is calling concerning where the ice pack placed(behind the knee or on the surgical site). ?Why her foot is still numb and why she received a short boot instead of a long one? ? I did go over with the patient the post op instructions but she still would like to talk to the doctor concerning her numbness. Please advise. ?

## 2021-08-04 NOTE — Telephone Encounter (Signed)
Attempted to call the patient.  No answer.  Left voicemail with postop instructions. -Dr. Amalia Hailey

## 2021-08-04 NOTE — Telephone Encounter (Signed)
Please advise 

## 2021-08-07 ENCOUNTER — Encounter: Payer: Self-pay | Admitting: Podiatry

## 2021-08-07 NOTE — Telephone Encounter (Signed)
Patient notified that they can get the knee scooter from [facility] in [place], will not need the order, does not file with insurance will not pay since this is considered a luxury item.

## 2021-08-08 NOTE — Telephone Encounter (Signed)
Thanks

## 2021-08-09 ENCOUNTER — Ambulatory Visit (INDEPENDENT_AMBULATORY_CARE_PROVIDER_SITE_OTHER): Payer: BC Managed Care – PPO

## 2021-08-09 ENCOUNTER — Other Ambulatory Visit: Payer: Self-pay

## 2021-08-09 ENCOUNTER — Ambulatory Visit (INDEPENDENT_AMBULATORY_CARE_PROVIDER_SITE_OTHER): Payer: BC Managed Care – PPO | Admitting: Podiatry

## 2021-08-09 DIAGNOSIS — Z9889 Other specified postprocedural states: Secondary | ICD-10-CM

## 2021-08-09 NOTE — Progress Notes (Signed)
? ?  Subjective:  ?Patient presents today status post Lapidus bunionectomy left foot. DOS: 08/03/2021.  Patient states that she really does not have much pain associated to the surgery.  She states that she does have some slight difficulty with the nonweightbearing but overall she is doing well and she presents wearing her cam boot with a knee scooter.  Husband present also today upon evaluation.  No new complaints at this time ? ?Past Medical History:  ?Diagnosis Date  ? Anxiety   ? Asthma   ? Cancer [facility]) 2016  ? Breast  ? Hyperlipidemia   ? Personal history of radiation therapy   ? ?Past Surgical History:  ?Procedure Laterality Date  ? ABDOMINAL HYSTERECTOMY  2018  ? complete  ? BREAST LUMPECTOMY Right 2016  ? COLONOSCOPY  2012  ? in [place]-normal exam  ? ?No Known Allergies ? ?Objective/Physical Exam ?Neurovascular status intact.  Skin incisions appear to be well coapted with sutures and staples intact. No sign of infectious process noted. No dehiscence. No active bleeding noted. Moderate edema noted to the surgical extremity with some diffuse ecchymosis. ? ?Radiographic Exam:  ?Orthopedic hardware and osteotomies sites appear to be stable with routine healing.  Good realignment of the first ray.  Well-healing surgical foot ? ?Assessment: ?1. s/p Lapidus bunionectomy left. DOS: 08/03/2021 ? ? ?Plan of Care:  ?1. Patient was evaluated. X-rays reviewed ?2.  Dressings changed today.  Clean dry and intact x1 week ?3.  Patient may minimally weight-bear in the cam boot.  Also use the knee scooter for assistance as needed ?4.  Return to clinic in 1 week for possible suture removal ? ? ?Edrick Kins, DPM ?[place] ? ?Dr. Edrick Kins, DPM  ?  ?[address]                                    ?[place], [place] [address]                ?Office [phone]  ?Fax [phone] ? ? ? ? ? ?

## 2021-08-16 ENCOUNTER — Other Ambulatory Visit: Payer: Self-pay

## 2021-08-16 ENCOUNTER — Ambulatory Visit (INDEPENDENT_AMBULATORY_CARE_PROVIDER_SITE_OTHER): Payer: BC Managed Care – PPO | Admitting: Podiatry

## 2021-08-16 DIAGNOSIS — Z9889 Other specified postprocedural states: Secondary | ICD-10-CM

## 2021-08-16 NOTE — Progress Notes (Addendum)
? ?  Subjective:  ?Patient presents today status post Lapidus bunionectomy left foot. DOS: 08/03/2021.  Patient continues to do well.  She has been using the knee scooter mostly for nonweightbearing to the surgical extremity.  Husband present also today upon evaluation.  No new complaints at this time ? ?Past Medical History:  ?Diagnosis Date  ? Anxiety   ? Asthma   ? Cancer [facility]) 2016  ? Breast  ? Hyperlipidemia   ? Personal history of radiation therapy   ? ?Past Surgical History:  ?Procedure Laterality Date  ? ABDOMINAL HYSTERECTOMY  2018  ? complete  ? BREAST LUMPECTOMY Right 2016  ? COLONOSCOPY  2012  ? in [place]-normal exam  ? ?No Known Allergies ? ?Objective/Physical Exam ?Neurovascular status intact.  Skin incisions appear to be well coapted with sutures and staples intact. No sign of infectious process noted. No dehiscence. No active bleeding noted. Moderate edema noted to the surgical extremity with some diffuse ecchymosis. ? ?Assessment: ?1. s/p Lapidus bunionectomy left. DOS: 08/03/2021 ? ? ?Plan of Care:  ?1. Patient was evaluated. ?2.  Sutures removed ?3.  Absolute minimal weightbearing in the cam boot for transitional purposes only.  Otherwise use the knee scooter.  Knee scooter is medically necessary. ?4.  Return to clinic in 2 weeks for follow-up x-ray ? ? ?Edrick Kins, DPM ?[place] ? ?Dr. Edrick Kins, DPM  ?  ?[address]                                    ?[place], [place] [address]                ?Office [phone]  ?Fax [phone] ? ? ? ? ? ?

## 2021-08-18 ENCOUNTER — Encounter: Payer: Self-pay | Admitting: Family Medicine

## 2021-08-18 ENCOUNTER — Ambulatory Visit (INDEPENDENT_AMBULATORY_CARE_PROVIDER_SITE_OTHER): Payer: BC Managed Care – PPO | Admitting: Family Medicine

## 2021-08-18 VITALS — BP 120/80 | HR 70 | Temp 97.8°F | Ht 65.0 in | Wt 207.0 lb

## 2021-08-18 DIAGNOSIS — I700 Atherosclerosis of aorta: Secondary | ICD-10-CM

## 2021-08-18 DIAGNOSIS — Z0000 Encounter for general adult medical examination without abnormal findings: Secondary | ICD-10-CM | POA: Diagnosis not present

## 2021-08-18 LAB — LIPID PANEL
Cholesterol: 212 mg/dL — ABNORMAL HIGH (ref 0–200)
HDL: 78.5 mg/dL (ref >39.00)
LDL Cholesterol: 107 mg/dL — ABNORMAL HIGH (ref 0–99)
NonHDL: 133.3
Total CHOL/HDL Ratio: 3
Triglycerides: 132 mg/dL (ref 0.0–149.0)
VLDL: 26.4 mg/dL (ref 0.0–40.0)

## 2021-08-18 LAB — COMPREHENSIVE METABOLIC PANEL
ALT: 25 U/L (ref 0–35)
AST: 19 U/L (ref 0–37)
Albumin: 4.2 g/dL (ref 3.5–5.2)
Alkaline Phosphatase: 67 U/L (ref 39–117)
BUN: 19 mg/dL (ref 6–23)
CO2: 28 mEq/L (ref 19–32)
Calcium: 9.6 mg/dL (ref 8.4–10.5)
Chloride: 102 mEq/L (ref 96–112)
Creatinine, Ser: 0.83 mg/dL (ref 0.40–1.20)
GFR: 75.82 mL/min (ref >60.00)
Glucose, Bld: 95 mg/dL (ref 70–99)
Potassium: 4.4 mEq/L (ref 3.5–5.1)
Sodium: 137 mEq/L (ref 135–145)
Total Bilirubin: 0.5 mg/dL (ref 0.2–1.2)
Total Protein: 6.7 g/dL (ref 6.0–8.3)

## 2021-08-18 LAB — CBC
HCT: 38.7 % (ref 36.0–46.0)
Hemoglobin: 12.9 g/dL (ref 12.0–15.0)
MCHC: 33.4 g/dL (ref 30.0–36.0)
MCV: 94.1 fl (ref 78.0–100.0)
Platelets: 304 10*3/uL (ref 150.0–400.0)
RBC: 4.11 Mil/uL (ref 3.87–5.11)
RDW: 13.9 % (ref 11.5–15.5)
WBC: 5.5 10*3/uL (ref 4.0–10.5)

## 2021-08-18 NOTE — Progress Notes (Signed)
Chief Complaint  ?Patient presents with  ? Annual Exam  ?  ? ?Well Woman ?Veronica Lloyd is here for a complete physical.   ?Her last physical was >1 year ago.  ?Current diet: in general, a "healthy" diet. ?Current exercise: walking when she isn't recovering for surgery. ?Weight is losing increasing and she denies fatigue out of ordinary. ?Seatbelt? Yes ?Advanced directive? No ? ?Health Maintenance ?Mammogram- Yes ?Colon cancer screening-Yes ?Shingrix- Yes ?Tetanus- Yes ?Hep C screening- Yes ?HIV screening- Yes ? ?Past Medical History:  ?Diagnosis Date  ? Anxiety   ? Asthma   ? Cancer [facility]) 2016  ? Breast  ? Hyperlipidemia   ? Personal history of radiation therapy   ?  ? ?Past Surgical History:  ?Procedure Laterality Date  ? ABDOMINAL HYSTERECTOMY  2018  ? complete  ? BREAST LUMPECTOMY Right 2016  ? COLONOSCOPY  2012  ? in [place]-normal exam  ? ? ?Medications  ?Current Outpatient Medications on File Prior to Visit  ?Medication Sig Dispense Refill  ? clonazePAM (KLONOPIN) 1 MG tablet TAKE 1/2-1 TABLET (0.5-1 MG TOTAL) BY MOUTH AS NEEDED FOR ANXIETY. 30 tablet 3  ? ibuprofen (ADVIL) 800 MG tablet Take 1 tablet (800 mg total) by mouth 3 (three) times daily. 90 tablet 1  ? oxyCODONE-acetaminophen (PERCOCET) 5-325 MG tablet Take 1 tablet by mouth every 4 (four) hours as needed for severe pain. 30 tablet 0  ? traZODone (DESYREL) 50 MG tablet TAKE 1/2-1 TABLETS (25-50 MG TOTAL) BY MOUTH AT BEDTIME AS NEEDED FOR SLEEP. 30 tablet 2  ? atorvastatin (LIPITOR) 80 MG tablet TAKE 1 TABLET BY MOUTH EVERY DAY 30 tablet 11  ? CALCIUM PO Take 500 mg by mouth daily.    ? COD LIVER OIL PO Take by mouth daily.    ? famotidine (PEPCID) 20 MG tablet TAKE 1 TABLET DAILY AS NEEDED FOR REFLUX. 30 tablet 2  ? gabapentin (NEURONTIN) 100 MG capsule Take 100 mg by mouth 2 (two) times daily.    ? MAGNESIUM PO Take by mouth daily.    ? phentermine 37.5 MG capsule Take 1 capsule (37.5 mg total) by mouth every morning. 30 capsule 0  ? topiramate  (TOPAMAX) 25 MG tablet Take 1 tab/dayfor 3 days then 1 tab 2x/day for 3 days. Add a pill in AM and then PM over 3 day intervals until taking 2 pills 2x/day. 120 tablet 1  ? ?Allergies ?No Known Allergies ? ?Review of Systems: ?Constitutional:  no unexpected weight changes ?Eye:  no recent significant change in vision ?Ear/Nose/Mouth/Throat:  Ears:  no recent change in hearing ?Nose/Mouth/Throat:  no complaints of nasal congestion, no sore throat ?Cardiovascular: no chest pain ?Respiratory:  no shortness of breath ?Gastrointestinal:  no abdominal pain, no change in bowel habits ?GU:  Female: negative for dysuria or pelvic pain ?Musculoskeletal/Extremities:  no new pain of the joints ?Integumentary (Skin/Breast):  no abnormal skin lesions reported ?Neurologic:  no headaches ?Endocrine:  denies fatigue ? ?Exam ?BP 120/80   Pulse 70   Temp 97.8 ?F (36.6 ?C) (Oral)   Ht '5\' 5"'$  (1.651 m)   Wt 207 lb (93.9 kg)   SpO2 98%   BMI 34.45 kg/m?  ?General:  well developed, well nourished, in no apparent distress ?Skin:  no significant moles, warts, or growths ?Head:  no masses, lesions, or tenderness ?Eyes:  pupils equal and round, sclera anicteric without injection ?Ears:  canals 100% obstructed w cerumen.  ?Nose:  nares patent, septum midline, mucosa normal, and  no drainage or sinus tenderness ?Throat/Pharynx:  lips and gingiva without lesion; tongue and uvula midline; non-inflamed pharynx; no exudates or postnasal drainage ?Neck: neck supple without adenopathy, thyromegaly, or masses ?Lungs:  clear to auscultation, breath sounds equal bilaterally, no respiratory distress ?Cardio:  regular rate and rhythm, no LE edema ?Abdomen:  abdomen soft, nontender; bowel sounds normal; no masses or organomegaly ?Genital: Defer to GYN ?Musculoskeletal:  symmetrical muscle groups noted without atrophy or deformity ?Extremities:  no clubbing, cyanosis, or edema, no deformities, no skin discoloration ?Neuro:  gait with slight limp due to  boot on L; deep tendon reflexes normal and symmetric ?Psych: well oriented with normal range of affect and appropriate judgment/insight ? ?Assessment and Plan ? ?Well adult exam - Plan: Comprehensive metabolic panel, Lipid panel, CBC ? ?Aortic calcification (HCC), Chronic  ? ?Well 61 y.o. female. ?Counseled on diet and exercise. ?Ear wax removal for home taken care of.  ?Advanced directive form provided today.  ?Other orders as above. ?Follow up in 6 mo. ?The patient voiced understanding and agreement to the plan. ? ?[name], DO ?08/18/21 ?10:13 AM ? ?

## 2021-08-18 NOTE — Patient Instructions (Addendum)
Give us 2-3 business days to get the results of your labs back.   Keep the diet clean and stay active.  Please get me a copy of your advanced directive form at your convenience.   Let us know if you need anything.  

## 2021-08-21 ENCOUNTER — Other Ambulatory Visit: Payer: Self-pay | Admitting: Family Medicine

## 2021-08-29 ENCOUNTER — Encounter: Payer: Self-pay | Admitting: Podiatry

## 2021-08-30 ENCOUNTER — Ambulatory Visit (INDEPENDENT_AMBULATORY_CARE_PROVIDER_SITE_OTHER): Payer: BC Managed Care – PPO | Admitting: Podiatry

## 2021-08-30 ENCOUNTER — Ambulatory Visit (INDEPENDENT_AMBULATORY_CARE_PROVIDER_SITE_OTHER): Payer: BC Managed Care – PPO

## 2021-08-30 DIAGNOSIS — M2012 Hallux valgus (acquired), left foot: Secondary | ICD-10-CM

## 2021-08-30 DIAGNOSIS — Z9889 Other specified postprocedural states: Secondary | ICD-10-CM

## 2021-08-31 NOTE — Telephone Encounter (Signed)
I sent a message to [facility] yesterday. I think she's taking care of it. Thanks Levada Dy! - Dr. Amalia Hailey

## 2021-09-03 NOTE — Progress Notes (Signed)
? ?  Subjective:  ?Patient presents today status post Lapidus bunionectomy left foot. DOS: 08/03/2021.  Patient doing well.  She does have some discomfort and tenderness along the incision sites.  No new complaints at this time ? ?Past Medical History:  ?Diagnosis Date  ? Anxiety   ? Asthma   ? Cancer [facility]) 2016  ? Breast  ? Hyperlipidemia   ? Personal history of radiation therapy   ? ?Past Surgical History:  ?Procedure Laterality Date  ? ABDOMINAL HYSTERECTOMY  2018  ? complete  ? BREAST LUMPECTOMY Right 2016  ? COLONOSCOPY  2012  ? in [place]-normal exam  ? ?No Known Allergies ? ?Objective/Physical Exam ?Neurovascular status intact.  Skin incisions healed.  There is some moderate edema to the foot.  Overall doing well ?Assessment: ?1. s/p Lapidus bunionectomy left. DOS: 08/03/2021 ? ? ?Plan of Care:  ?1. Patient was evaluated. ?2.  Today were going to continue the cam boot for 2 additional weeks.  After that she may begin to transition out of the cam boot into good supportive tennis shoes and sneakers ?3.  Return to clinic in 4 weeks ? ?*Patient has a conference in [place] in 2 weeks ? ? ?Edrick Kins, DPM ?[place] ? ?Dr. Edrick Kins, DPM  ?  ?[address]                                    ?[place], [place] [address]                ?Office [phone]  ?Fax [phone] ? ? ? ? ? ?

## 2021-09-05 NOTE — Telephone Encounter (Signed)
Dr. Amalia Hailey, I dont deal with documentation to show that something is a medical neccessity. You may want to reach out to Ammie to see if she does that. I spoke with Jocelyn Lamer and she said to speak with Darrick Penna, I spoke with Darrick Penna and she directed me to Jocelyn Lamer, but since it wasent filed on insurance than its nothing Jocelyn Lamer can do. I will send your office note over to [phone], but it does not prove that it is a medical necessity. ?

## 2021-09-05 NOTE — Telephone Encounter (Signed)
THANK YOU

## 2021-09-05 NOTE — Telephone Encounter (Signed)
Could someone please just write a quick doctors note stating that the knee scooter is medically necessary? Patient can just come in and pick up the note at the front desk so she can submit it online to her insurance. Thanks, Dr. Amalia Hailey

## 2021-09-24 ENCOUNTER — Other Ambulatory Visit: Payer: Self-pay | Admitting: Family Medicine

## 2021-09-24 DIAGNOSIS — F418 Other specified anxiety disorders: Secondary | ICD-10-CM

## 2021-09-27 ENCOUNTER — Ambulatory Visit (INDEPENDENT_AMBULATORY_CARE_PROVIDER_SITE_OTHER): Payer: BC Managed Care – PPO | Admitting: Podiatry

## 2021-09-27 DIAGNOSIS — Z9889 Other specified postprocedural states: Secondary | ICD-10-CM

## 2021-09-30 ENCOUNTER — Other Ambulatory Visit: Payer: Self-pay | Admitting: Podiatry

## 2021-10-02 NOTE — Telephone Encounter (Signed)
[place] item (scooter)considered by insurance, patient will have to pay out of pocket.

## 2021-10-02 NOTE — Telephone Encounter (Signed)
Please advise 

## 2021-10-08 ENCOUNTER — Other Ambulatory Visit: Payer: Self-pay | Admitting: Family Medicine

## 2021-10-09 NOTE — Progress Notes (Signed)
? ?  Subjective:  ?Patient presents today status post Lapidus bunionectomy left foot. DOS: 08/03/2021.  Patient states that she is doing incredibly well.  She did have a trip to [place] and she did a significant amount of walking from for about 2 weeks now in regular shoes with no complaints and no swelling. ? ?Past Medical History:  ?Diagnosis Date  ? Anxiety   ? Asthma   ? Cancer [facility]) 2016  ? Breast  ? Hyperlipidemia   ? Personal history of radiation therapy   ? ?Past Surgical History:  ?Procedure Laterality Date  ? ABDOMINAL HYSTERECTOMY  2018  ? complete  ? BREAST LUMPECTOMY Right 2016  ? COLONOSCOPY  2012  ? in [place]-normal exam  ? ?No Known Allergies ? ?Assessment: ?1. s/p Lapidus bunionectomy left. DOS: 08/03/2021 ? ? ?Plan of Care:  ?1. Patient was evaluated. ?2.  Overall the patient is doing very well.  She may now slowly increase to full activity no restrictions ?3.  Recommend good supportive shoes and sneakers ?4.  Return to clinic as needed ? ?*Patient has a conference in [place] in 2 weeks ? ? ?Edrick Kins, DPM ?[place] ? ?Dr. Edrick Kins, DPM  ?  ?[address]                                    ?[place],  [address]                ?Office [phone]  ?Fax [phone] ? ? ? ? ? ?

## 2021-11-29 ENCOUNTER — Other Ambulatory Visit: Payer: Self-pay | Admitting: Family Medicine

## 2021-11-29 DIAGNOSIS — F418 Other specified anxiety disorders: Secondary | ICD-10-CM

## 2021-12-18 ENCOUNTER — Ambulatory Visit: Payer: BC Managed Care – PPO | Admitting: Dermatology

## 2021-12-23 ENCOUNTER — Other Ambulatory Visit: Payer: Self-pay | Admitting: Family Medicine

## 2021-12-23 DIAGNOSIS — F418 Other specified anxiety disorders: Secondary | ICD-10-CM

## 2022-01-22 ENCOUNTER — Encounter (INDEPENDENT_AMBULATORY_CARE_PROVIDER_SITE_OTHER): Payer: Self-pay | Admitting: Internal Medicine

## 2022-01-22 ENCOUNTER — Ambulatory Visit (INDEPENDENT_AMBULATORY_CARE_PROVIDER_SITE_OTHER): Payer: BC Managed Care – PPO | Admitting: Internal Medicine

## 2022-01-22 VITALS — BP 124/78 | HR 69 | Temp 98.3°F | Resp 16 | Ht 65.0 in | Wt 211.0 lb

## 2022-01-22 DIAGNOSIS — Z7689 Persons encountering health services in other specified circumstances: Secondary | ICD-10-CM

## 2022-01-22 DIAGNOSIS — F32A Depression, unspecified: Secondary | ICD-10-CM

## 2022-01-22 DIAGNOSIS — E782 Mixed hyperlipidemia: Secondary | ICD-10-CM

## 2022-01-22 DIAGNOSIS — C50911 Malignant neoplasm of unspecified site of right female breast: Secondary | ICD-10-CM

## 2022-01-22 DIAGNOSIS — F419 Anxiety disorder, unspecified: Secondary | ICD-10-CM

## 2022-01-22 DIAGNOSIS — K219 Gastro-esophageal reflux disease without esophagitis: Secondary | ICD-10-CM

## 2022-01-22 DIAGNOSIS — R053 Chronic cough: Secondary | ICD-10-CM

## 2022-01-22 DIAGNOSIS — M79672 Pain in left foot: Secondary | ICD-10-CM

## 2022-01-22 MED ORDER — BUPROPION HCL ER (XL) 300 MG PO TB24
300.0000 mg | ORAL_TABLET | Freq: Every day | ORAL | 2 refills | Status: DC
Start: 2022-01-22 — End: 2022-10-04

## 2022-01-22 NOTE — Progress Notes (Signed)
Have you seen any specialists/other providers since your last visit with us?    No    Health Maintenance Due   Topic Date Due    DEPRESSION SCREENING  Never done

## 2022-01-22 NOTE — Progress Notes (Signed)
[place] [facility]                    Subjective:      Patient ID: Donna Andrews is a 61 y.o. female.  Attending Physician: Ramon Dredge, MD    Chief Complaint:  Chief Complaint   Patient presents with    Establish Care     New patient to me. Moved from [place] (originally from [place]).She is here to establish care and discuss the following:     #HLD  -Patient has been taking atorvastatin 80 mg tablet daily. It was increased after aortic calcification seen on an imaging.    #Anxiety and Depression   -She has been taking Wellbutrin 150 mg tablet twice daily and trazodone 50 mg tablet nightly for sleep.   She takes clonazepam as needed for anxiety. She has been taking it almost everyday. She has been out of it for 4 weeks and has not had any withdrawal symptoms. Pt notes she was given 30 tablets in 10/2021. It was started in 2011 when she got laid off.   She notes that she is okay not taking this medication anymore, especially given it's potential adverse effects.  However, she does note slightly increased anxiety lately.  No panic attacks.    She has been drinking 2-3 wine at night    #Neuropathy on her throat - diagnosed by ENT after persistent cough following Covid 19.   She takes gabapentin 100 mg capsule (2 in the morning and 1 at night)    #GERD - Takes pepcid     #Left foot big toe lapiplasty - Wants physical therapy referral in the area. Sees Dr Allyne Gee at [facility], [place] , who referred her to PT in [place] and that is far from her.     #Right sided breast cancer s/p removal of the lesion and radiation. It was diagnosed in Sept 2016 in [place].     Mild asthma - diagnosed many years ago but no symptoms now. Does not take PRN or maintenance inahler.     Problem List:  There is no problem list on file for this patient.     Current Medications:  Outpatient Medications Marked as Taking for the 01/22/22 encounter (Office Visit) with Demarcus Thielke, Donia Pounds, MD   Medication  Sig Dispense Refill    atorvastatin (LIPITOR) 80 MG tablet Take 1 tablet (80 mg) by mouth daily      buPROPion SR (WELLBUTRIN SR) 150 MG 12 hr tablet Take 1 tablet (150 mg) by mouth 2 (two) times daily      Calcium Citrate-Vitamin D 315-5 MG-MCG Tab Take by mouth      famotidine (PEPCID) 20 MG tablet TAKE 1 TABLET DAILY AS NEEDED FOR REFLUX.      gabapentin (NEURONTIN) 100 MG capsule Take 1 capsule (100 mg) by mouth 3 (three) times daily      magnesium 30 MG tablet Take 1 tablet (30 mg) by mouth 2 (two) times daily      traZODone (DESYREL) 50 MG tablet TAKE 1/2-1 TABLETS (25-50 MG TOTAL) BY MOUTH AT BEDTIME AS NEEDED FOR SLEEP.           The following sections were reviewed this encounter by the provider:   Tobacco  Allergies  Meds  Problems  Med Hx  Surg Hx  Fam Hx             ROS:  Review of Systems   Constitutional:  Negative for chills, diaphoresis, fever, malaise/fatigue and weight loss.   Eyes:  Negative for blurred vision, double vision and photophobia.   Respiratory:  Negative for cough and shortness of breath.    Cardiovascular:  Negative for chest pain, palpitations, orthopnea and leg swelling.   Gastrointestinal:  Negative for abdominal pain, diarrhea, nausea and vomiting.   Genitourinary:  Negative for dysuria, frequency and hematuria.   Musculoskeletal:  Negative for joint pain and myalgias.   Neurological:  Negative for dizziness, focal weakness and headaches.   Psychiatric/Behavioral:  Negative for depression, substance abuse and suicidal ideas. The patient is nervous/anxious.           Objective:     Vitals:  BP 124/78 (BP Site: Left arm, Patient Position: Sitting, Cuff Size: Large)   Pulse 69   Temp 98.3 F (36.8 C) (Temporal)   Resp 16   Ht 1.651 m ('5\' 5"'$ )   Wt 95.7 kg (211 lb)   SpO2 98%   BMI 35.11 kg/m     Physical Exam:  Physical Exam  Vitals reviewed.   Constitutional:       General: She is not in acute distress.  HENT:      Head: Normocephalic and atraumatic.   Cardiovascular:       Rate and Rhythm: Normal rate.      Pulses: Normal pulses.      Heart sounds: Normal heart sounds.   Pulmonary:      Effort: Pulmonary effort is normal. No respiratory distress.      Breath sounds: Normal breath sounds.   Abdominal:      General: There is no distension.      Tenderness: There is no abdominal tenderness.   Musculoskeletal:         General: No swelling. Normal range of motion.      Cervical back: Normal range of motion and neck supple.   Skin:     General: Skin is warm and dry.   Neurological:      Mental Status: She is alert and oriented to person, place, and time.   Psychiatric:         Mood and Affect: Mood normal.         Behavior: Behavior normal.         Thought Content: Thought content normal.         Judgment: Judgment normal.           Assessment/Plan   Dajuana was seen today for establish care.    Diagnoses and all orders for this visit:    Encounter to establish care with new doctor    Anxiety and depression - I will increase the dose of wellbutrin from wellbutrin SR 150 mg BID to 300 mg daily  -    buPROPion XL (Wellbutrin XL) 300 MG 24 hr tablet; Take 1 tablet (300 mg) by mouth daily  -    F/u in 4 weeks     Mixed hyperlipidemia - continue statin     Left foot pain  -     Referral to Physical Therapy - EXTERNAL; Future    Cough, persistent - diagnosed by ENT as neuropathic cause of cough. Currently stable on gabapentin.     Gastroesophageal reflux disease, unspecified whether esophagitis present - continue pepcid    Malignant neoplasm of right female breast, unspecified estrogen receptor status, unspecified site of breast - s/p resection & radiation. Continue yearly mammograms      Ramon Dredge, MD

## 2022-02-12 ENCOUNTER — Ambulatory Visit: Payer: BC Managed Care – PPO | Admitting: Family Medicine

## 2022-02-16 ENCOUNTER — Ambulatory Visit (INDEPENDENT_AMBULATORY_CARE_PROVIDER_SITE_OTHER): Payer: BC Managed Care – PPO | Admitting: Internal Medicine

## 2022-02-16 ENCOUNTER — Encounter (INDEPENDENT_AMBULATORY_CARE_PROVIDER_SITE_OTHER): Payer: Self-pay | Admitting: Internal Medicine

## 2022-02-16 VITALS — BP 129/87 | HR 61 | Temp 98.2°F | Resp 14 | Ht 65.0 in | Wt 212.0 lb

## 2022-02-16 DIAGNOSIS — Z1283 Encounter for screening for malignant neoplasm of skin: Secondary | ICD-10-CM

## 2022-02-16 DIAGNOSIS — F32A Depression, unspecified: Secondary | ICD-10-CM

## 2022-02-16 DIAGNOSIS — F419 Anxiety disorder, unspecified: Secondary | ICD-10-CM

## 2022-02-16 DIAGNOSIS — Z23 Encounter for immunization: Secondary | ICD-10-CM

## 2022-02-16 NOTE — Patient Instructions (Signed)
[facility] [place]  [address] [place]  [address]  Get Directions  Phone: [phone]  [phone]  Fax: [phone]

## 2022-02-16 NOTE — Progress Notes (Signed)
Have you seen any specialists/other providers since your last visit with [place]?    No    Health Maintenance Due   Topic Date Due    Cervical Cancer Screening Three Years  Never done    Colorectal Cancer Screening  Never done    Advance Directive on File  Never done    MAMMOGRAM  Never done    Annual Exam  Never done    Shingrix Vaccine 50+ (1) Never done    Tetanus Ten-Year  Never done    DEPRESSION SCREENING  Never done    HEPATITIS C SCREENING  Never done    COVID-19 Vaccine (4 - Moderna series) 06/16/2021    INFLUENZA VACCINE  Never done

## 2022-02-16 NOTE — Progress Notes (Signed)
[place] [facility]                    Subjective:      Patient ID: Donna Andrews is a 61 y.o. female.  Attending Physician: Donna Dredge, MD    Chief Complaint:  Chief Complaint   Patient presents with    Follow-up     Anxiety       She has been taking wellbutrin 300 mg tablet daily (change from 150 mg BID)+ trazodone 50 mg tablet for sleep.   She weaned herself off of clonazepam with no withdrawal sx.   She is very glad to be off of clonazepam and feels better than she has felt in a long time.       She has slight congestion and wattery eyes and runny nose. Home covid 19 test is negative.     Saw an eye doctor for [facility] and was referred to optho for reticular gegeneration.     Problem List:  Patient Active Problem List   Diagnosis    Situational anxiety    Hyperlipidemia    Gastroesophageal reflux disease    Cough, persistent    Aortic calcification    Breast cancer    Mild persistent asthma        Current Medications:  Outpatient Medications Marked as Taking for the 02/16/22 encounter (Office Visit) with Donna Andrews, Donna Pounds, MD   Medication Sig Dispense Refill    atorvastatin (LIPITOR) 80 MG tablet Take 1 tablet (80 mg) by mouth daily      buPROPion XL (Wellbutrin XL) 300 MG 24 hr tablet Take 1 tablet (300 mg) by mouth daily 90 tablet 2    Calcium Citrate-Vitamin D 315-5 MG-MCG Tab Take by mouth      famotidine (PEPCID) 20 MG tablet TAKE 1 TABLET DAILY AS NEEDED FOR REFLUX.      gabapentin (NEURONTIN) 100 MG capsule Take 1 capsule (100 mg) by mouth 3 (three) times daily      magnesium 30 MG tablet Take 1 tablet (30 mg) by mouth 2 (two) times daily      traZODone (DESYREL) 50 MG tablet TAKE 1/2-1 TABLETS (25-50 MG TOTAL) BY MOUTH AT BEDTIME AS NEEDED FOR SLEEP.           The following sections were reviewed this encounter by the provider:   Tobacco  Allergies  Meds  Problems  Med Hx  Surg Hx  Fam Hx             ROS:  Review of Systems   Constitutional:  Negative for chills,  diaphoresis, fever, malaise/fatigue and weight loss.   Eyes:  Negative for blurred vision, double vision and photophobia.   Respiratory:  Negative for cough and shortness of breath.    Cardiovascular:  Negative for chest pain, palpitations, orthopnea and leg swelling.   Gastrointestinal:  Negative for abdominal pain, diarrhea, nausea and vomiting.   Genitourinary:  Negative for dysuria, frequency and hematuria.   Neurological:  Negative for dizziness, focal weakness and headaches.   Psychiatric/Behavioral:  Negative for depression, hallucinations, substance abuse and suicidal ideas. The patient is not nervous/anxious and does not have insomnia.              Objective:     Vitals:  BP 129/87 (BP Site: Left arm, Patient Position: Sitting, Cuff Size: Large)   Pulse 61   Temp 98.2 F (36.8 C) (Temporal)   Resp 14   Ht 1.651 m (  $'5\' 5"'b$ )   Wt 96.2 kg (212 lb)   SpO2 98%   BMI 35.28 kg/m     Physical Exam:  Physical Exam  Vitals reviewed.   Constitutional:       General: She is not in acute distress.  HENT:      Head: Normocephalic and atraumatic.   Cardiovascular:      Rate and Rhythm: Normal rate.      Pulses: Normal pulses.      Heart sounds: Normal heart sounds.   Pulmonary:      Effort: Pulmonary effort is normal. No respiratory distress.      Breath sounds: Normal breath sounds.   Musculoskeletal:         General: Normal range of motion.      Cervical back: Normal range of motion and neck supple.   Skin:     General: Skin is warm and dry.   Neurological:      Mental Status: She is alert and oriented to person, place, and time.   Psychiatric:         Mood and Affect: Mood normal.         Behavior: Behavior normal.         Thought Content: Thought content normal.         Judgment: Judgment normal.           Assessment/Plan   Donna Andrews was seen today for follow-up.    Diagnoses and all orders for this visit:    Anxiety and depression - patient feels much better since being off of clonazepam. Will continue current  dose of wellbutrin and trazodone nightly prn.  - Continue wellbutrin 300 mg tablet daily     Need for vaccination  -     Flu vaccine QUADRIVALENT (PF) 6 months and older (FLULAVAL/FLUARIX)    Screening for skin cancer  -     Referral to Dermatology - EXTERNAL; Future      RTC in 3-6 months for annual physical and labs.    Donna Dredge, MD

## 2022-02-17 ENCOUNTER — Telehealth (INDEPENDENT_AMBULATORY_CARE_PROVIDER_SITE_OTHER): Payer: Self-pay | Admitting: Internal Medicine

## 2022-02-17 NOTE — Telephone Encounter (Signed)
Good Afternoon,    PT called to make a medication request The Rxre needed is:     gabapentin (NEURONTIN) 100 MG capsule    PT would like Rx to be sent to:    [id number] [address] [place]  Phone:  [phone] Fax:  [phone]

## 2022-02-19 ENCOUNTER — Other Ambulatory Visit (INDEPENDENT_AMBULATORY_CARE_PROVIDER_SITE_OTHER): Payer: Self-pay | Admitting: Internal Medicine

## 2022-02-19 MED ORDER — GABAPENTIN 100 MG PO CAPS
100.0000 mg | ORAL_CAPSULE | Freq: Three times a day (TID) | ORAL | 0 refills | Status: DC
Start: 2022-02-19 — End: 2022-06-11

## 2022-02-19 NOTE — Telephone Encounter (Signed)
LOV:02/16/22  Next Visit: 06/05/22

## 2022-03-21 ENCOUNTER — Other Ambulatory Visit: Payer: Self-pay | Admitting: Family Medicine

## 2022-03-21 DIAGNOSIS — F418 Other specified anxiety disorders: Secondary | ICD-10-CM

## 2022-06-05 ENCOUNTER — Encounter (INDEPENDENT_AMBULATORY_CARE_PROVIDER_SITE_OTHER): Payer: BC Managed Care – PPO | Admitting: Internal Medicine

## 2022-06-05 ENCOUNTER — Other Ambulatory Visit (INDEPENDENT_AMBULATORY_CARE_PROVIDER_SITE_OTHER): Payer: Self-pay | Admitting: Internal Medicine

## 2022-06-05 NOTE — Telephone Encounter (Signed)
Last refill 02/02/22  Last o/v 02/02/22  Appt. 06/13/21  Queued up for 90 day supply, 0 refills

## 2022-06-11 ENCOUNTER — Other Ambulatory Visit (INDEPENDENT_AMBULATORY_CARE_PROVIDER_SITE_OTHER): Payer: Self-pay | Admitting: Internal Medicine

## 2022-06-13 ENCOUNTER — Other Ambulatory Visit: Payer: Self-pay | Admitting: Family Medicine

## 2022-06-13 ENCOUNTER — Ambulatory Visit (INDEPENDENT_AMBULATORY_CARE_PROVIDER_SITE_OTHER): Payer: BC Managed Care – PPO | Admitting: Internal Medicine

## 2022-06-13 ENCOUNTER — Encounter (INDEPENDENT_AMBULATORY_CARE_PROVIDER_SITE_OTHER): Payer: Self-pay | Admitting: Internal Medicine

## 2022-06-13 VITALS — BP 114/77 | HR 64 | Temp 98.7°F | Resp 16 | Ht 65.0 in | Wt 213.0 lb

## 2022-06-13 DIAGNOSIS — F418 Other specified anxiety disorders: Secondary | ICD-10-CM

## 2022-06-13 DIAGNOSIS — Z131 Encounter for screening for diabetes mellitus: Secondary | ICD-10-CM

## 2022-06-13 DIAGNOSIS — Z1159 Encounter for screening for other viral diseases: Secondary | ICD-10-CM

## 2022-06-13 DIAGNOSIS — Z1231 Encounter for screening mammogram for malignant neoplasm of breast: Secondary | ICD-10-CM

## 2022-06-13 DIAGNOSIS — Z0000 Encounter for general adult medical examination without abnormal findings: Secondary | ICD-10-CM

## 2022-06-13 LAB — COMPREHENSIVE METABOLIC PANEL
ALT: 25 U/L (ref 0–55)
AST (SGOT): 19 U/L (ref 5–41)
Albumin/Globulin Ratio: 1.6 (ref 0.9–2.2)
Albumin: 4.2 g/dL (ref 3.5–5.0)
Alkaline Phosphatase: 65 U/L (ref 37–117)
Anion Gap: 8 (ref 5.0–15.0)
BUN: 14 mg/dL (ref 7.0–21.0)
Bilirubin, Total: 1 mg/dL (ref 0.2–1.2)
CO2: 23 mEq/L (ref 17–29)
Calcium: 8.7 mg/dL (ref 8.5–10.5)
Chloride: 106 mEq/L (ref 99–111)
Creatinine: 0.8 mg/dL (ref 0.4–1.0)
Globulin: 2.6 g/dL (ref 2.0–3.6)
Glucose: 94 mg/dL (ref 70–100)
Potassium: 4.2 mEq/L (ref 3.5–5.3)
Protein, Total: 6.8 g/dL (ref 6.0–8.3)
Sodium: 137 mEq/L (ref 135–145)
eGFR: >60 mL/min/{1.73_m2} (ref >=60)

## 2022-06-13 LAB — CBC AND DIFFERENTIAL
Absolute NRBC: 0 10*3/uL (ref 0.00–0.00)
Basophils Absolute Automated: 0.03 10*3/uL (ref 0.00–0.08)
Basophils Automated: 0.5 %
Eosinophils Absolute Automated: 0.09 10*3/uL (ref 0.00–0.44)
Eosinophils Automated: 1.4 %
Hematocrit: 38.5 % (ref 34.7–43.7)
Hgb: 12.9 g/dL (ref 11.4–14.8)
Immature Granulocytes Absolute: 0.02 10*3/uL (ref 0.00–0.07)
Immature Granulocytes: 0.3 %
Instrument Absolute Neutrophil Count: 4.06 10*3/uL (ref 1.10–6.33)
Lymphocytes Absolute Automated: 1.53 10*3/uL (ref 0.42–3.22)
Lymphocytes Automated: 24.1 %
MCH: 31.7 pg (ref 25.1–33.5)
MCHC: 33.5 g/dL (ref 31.5–35.8)
MCV: 94.6 fL (ref 78.0–96.0)
MPV: 10.6 fL (ref 8.9–12.5)
Monocytes Absolute Automated: 0.62 10*3/uL (ref 0.21–0.85)
Monocytes: 9.8 %
Neutrophils Absolute: 4.06 10*3/uL (ref 1.10–6.33)
Neutrophils: 63.9 %
Nucleated RBC: 0 /100 WBC (ref 0.0–0.0)
Platelets: 269 10*3/uL (ref 142–346)
RBC: 4.07 10*6/uL (ref 3.90–5.10)
RDW: 13 % (ref 11–15)
WBC: 6.35 10*3/uL (ref 3.10–9.50)

## 2022-06-13 LAB — LIPID PANEL
Cholesterol / HDL Ratio: 2.7 Index
Cholesterol: 204 mg/dL — ABNORMAL HIGH (ref 0–199)
HDL: 75 mg/dL (ref 40–9999)
LDL Calculated: 107 mg/dL — ABNORMAL HIGH (ref 0–99)
Triglycerides: 112 mg/dL (ref 34–149)
VLDL Calculated: 22 mg/dL (ref 10–40)

## 2022-06-13 LAB — URINALYSIS WITH MICROSCOPIC
Bilirubin, UA: NEGATIVE
Blood, UA: NEGATIVE
Glucose, UA: NEGATIVE
Ketones UA: NEGATIVE
Nitrite, UA: NEGATIVE
Protein, UR: NEGATIVE
Specific Gravity UA: 1.012 (ref 1.001–1.035)
Urine pH: 6 (ref 5.0–8.0)
Urobilinogen, UA: NORMAL mg/dL

## 2022-06-13 LAB — HEPATITIS C ANTIBODY, TOTAL: Hepatitis C, AB: NONREACTIVE

## 2022-06-13 LAB — HEMOLYSIS INDEX(SOFT): Hemolysis Index: 10 Index (ref 0–24)

## 2022-06-13 LAB — HEMOGLOBIN A1C
Average Estimated Glucose: 108.3 mg/dL
Hemoglobin A1C: 5.4 % (ref 4.6–5.6)

## 2022-06-13 LAB — THYROID STIMULATING HORMONE (TSH), REFLEX ON ABNORMAL TO FREE T4, SERUM: TSH, Abn Reflex to Free T4, Serum: 0.97 u[IU]/mL (ref 0.35–4.94)

## 2022-06-13 NOTE — Progress Notes (Signed)
ANNUAL PHYSICAL EXAM                                                                         HPI: Patient is a 62 y.o. female who presents today for a complete physical exam.    Reported Health: good health  Diet: She has been watching her portion size and eating well balanced meal.   Exercise: she recently joined a community center and has started swimming.   Body mass index is 35.45 kg/m.    Dental: regular dental visits twice a year  Vision: glasses and regular eye exams   Hearing: normal hearing    Sleep: Trazodone nightly helps her with sleep.   Depression Screen: Negative.     Social:  Lives with: husband.  Work: Solicitor   Tobacco: none  Alcohol: 2-3 glasses of wine nightly   Illicit Drug Use: No      Reproductive Health: sexually active  Safety Elements Used: uses seat belts, smoke detectors in household, carbon monoxide detectors in household, sunscreen use, and does not text and drive    Health Maintenance:  Immunization Status: immunizations up to date  Prior Screening Tests: mammogram 1 year ago  General Health Risks: no family history of colon cancer and no family history of breast cancer      Past Medical History:   Diagnosis Date    Breast cancer     Hyperlipidemia        Past Surgical History:   Procedure Laterality Date    BREAST SURGERY      CESAREAN SECTION      HYSTERECTOMY         Family History   Problem Relation Age of Onset    Heart attack Father        Allergies: Patient has no known allergies.    Medications:  Current Outpatient Medications   Medication Sig Dispense Refill    atorvastatin (LIPITOR) 80 MG tablet Take 1 tablet (80 mg) by mouth daily      buPROPion XL (Wellbutrin XL) 300 MG 24 hr tablet Take 1 tablet (300 mg) by mouth daily 90 tablet 2    Calcium Citrate-Vitamin D 315-5 MG-MCG Tab Take by mouth      famotidine (PEPCID) 20 MG tablet TAKE 1 TABLET DAILY AS NEEDED FOR REFLUX.      gabapentin (NEURONTIN) 100 MG capsule TAKE 1 CAPSULE (100 MG) BY MOUTH 3 TIMES A DAY 90  capsule 0    magnesium 30 MG tablet Take 1 tablet (30 mg) by mouth 2 (two) times daily      traZODone (DESYREL) 50 MG tablet TAKE 1/2-1 TABLETS (25-50 MG TOTAL) BY MOUTH AT BEDTIME AS NEEDED FOR SLEEP.       No current facility-administered medications for this visit.       Social History:  Patient  reports that she has never smoked. She has never used smokeless tobacco. She reports current alcohol use. She reports that she does not use drugs.      ROS:  Review of Systems   Constitutional:  Negative for chills, diaphoresis, fever, malaise/fatigue and weight loss.   HENT:  Negative for congestion, ear pain, hearing loss, sinus pain and sore throat.  Eyes:  Negative for blurred vision and double vision.   Respiratory:  Negative for cough and shortness of breath.    Cardiovascular:  Negative for chest pain, palpitations and leg swelling.   Gastrointestinal:  Negative for abdominal pain, blood in stool, constipation, diarrhea, nausea and vomiting.   Genitourinary:  Negative for dysuria and hematuria.   Musculoskeletal:  Negative for joint pain and myalgias.   Skin:  Negative for rash.   Neurological:  Negative for dizziness, focal weakness and headaches.   Psychiatric/Behavioral:  Negative for depression. The patient is not nervous/anxious.          Objective:   BP 114/77 (BP Site: Left arm, Patient Position: Sitting, Cuff Size: Large)   Pulse 64   Temp 98.7 F (37.1 C) (Temporal)   Resp 16   Ht 1.651 m ('5\' 5"'$ )   Wt 96.6 kg (213 lb)   SpO2 97%   BMI 35.45 kg/m     Examination:   Physical Exam  Vitals reviewed.   Constitutional:       General: She is not in acute distress.     Appearance: She is normal weight.   HENT:      Head: Normocephalic and atraumatic.      Right Ear: Tympanic membrane, ear canal and external ear normal.      Left Ear: Tympanic membrane, ear canal and external ear normal.      Nose: Nose normal. No congestion.      Mouth/Throat:      Pharynx: Oropharynx is clear. No oropharyngeal  exudate.   Eyes:      Extraocular Movements: Extraocular movements intact.      Conjunctiva/sclera: Conjunctivae normal.   Cardiovascular:      Rate and Rhythm: Normal rate and regular rhythm.      Pulses: Normal pulses.      Heart sounds: Normal heart sounds.   Pulmonary:      Effort: Pulmonary effort is normal.      Breath sounds: Normal breath sounds.   Abdominal:      General: Bowel sounds are normal. There is no distension.      Palpations: Abdomen is soft.      Tenderness: There is no abdominal tenderness.   Musculoskeletal:         General: No swelling. Normal range of motion.      Cervical back: Normal range of motion and neck supple.   Lymphadenopathy:      Cervical: No cervical adenopathy.   Skin:     General: Skin is warm and dry.   Neurological:      Mental Status: She is oriented to person, place, and time.   Psychiatric:         Mood and Affect: Mood normal.         Behavior: Behavior normal.               Assessment & Plan        1. Annual physical exam  - CBC and differential  - Comprehensive metabolic panel  - Lipid panel  - Urinalysis with microscopic  - TSH, Abn Reflex to Free T4, Serum    2. Screening for diabetes mellitus  - Hemoglobin A1C    3. Need for hepatitis C screening test  - Hepatitis C (HCV) antibody, Total    4. Encounter for screening mammogram for malignant neoplasm of breast  - Mammo Screening 3D/Tomo Bilateral; Future        Health Maintenance:  Recommend optimizing low carbohydrate diet efforts and obtaining at least 150 minutes of aerobic exercise per week. Recommend 20-25 grams of dietary fiber daily. Recommend drinking at least 60-80 ounces of water per day.Immunizations UTD. Vision screening UTD. Dental Screening UTD. Colonoscopy is UTD. Gynecologist surveillance is UTD. Mammogram screening is due.  Screening mammogram ordered.      Ramon Dredge, MD

## 2022-06-13 NOTE — Progress Notes (Signed)
Have you seen any specialists/other providers since your last visit with [place]?    No    Health Maintenance Due   Topic Date Due    Cervical Cancer Screening Three Years  Never done    Colorectal Cancer Screening  Never done    Advance Directive on File  Never done    MAMMOGRAM  Never done    Annual Exam  Never done    HEPATITIS C SCREENING  Never done

## 2022-07-13 ENCOUNTER — Other Ambulatory Visit (INDEPENDENT_AMBULATORY_CARE_PROVIDER_SITE_OTHER): Payer: Self-pay | Admitting: Internal Medicine

## 2022-07-13 NOTE — Telephone Encounter (Signed)
Last refill 06/11/22   Last o/v 06/13/22  Appt. No appt schedule   Queued up for 90 day supply, 0 refills

## 2022-07-20 ENCOUNTER — Ambulatory Visit
Admission: RE | Admit: 2022-07-20 | Discharge: 2022-07-20 | Disposition: A | Discharge status: Home / Self Care | Payer: BC Managed Care – PPO | Source: Ambulatory Visit | Attending: Internal Medicine | Admitting: Internal Medicine

## 2022-07-20 DIAGNOSIS — Z1231 Encounter for screening mammogram for malignant neoplasm of breast: Secondary | ICD-10-CM | POA: Insufficient documentation

## 2022-07-24 ENCOUNTER — Encounter (INDEPENDENT_AMBULATORY_CARE_PROVIDER_SITE_OTHER): Payer: Self-pay | Admitting: Internal Medicine

## 2022-07-24 ENCOUNTER — Other Ambulatory Visit (INDEPENDENT_AMBULATORY_CARE_PROVIDER_SITE_OTHER): Payer: Self-pay | Admitting: Internal Medicine

## 2022-07-24 DIAGNOSIS — R928 Other abnormal and inconclusive findings on diagnostic imaging of breast: Secondary | ICD-10-CM

## 2022-07-24 NOTE — Progress Notes (Signed)
Diagnostic mammogram + Ultrasound.

## 2022-07-25 ENCOUNTER — Other Ambulatory Visit (INDEPENDENT_AMBULATORY_CARE_PROVIDER_SITE_OTHER): Payer: Self-pay | Admitting: Internal Medicine

## 2022-07-25 ENCOUNTER — Ambulatory Visit
Admission: RE | Admit: 2022-07-25 | Discharge: 2022-07-25 | Disposition: A | Discharge status: Home / Self Care | Payer: BC Managed Care – PPO | Source: Ambulatory Visit | Attending: Internal Medicine | Admitting: Internal Medicine

## 2022-07-25 DIAGNOSIS — R928 Other abnormal and inconclusive findings on diagnostic imaging of breast: Secondary | ICD-10-CM

## 2022-08-03 ENCOUNTER — Other Ambulatory Visit: Payer: Self-pay

## 2022-08-03 ENCOUNTER — Ambulatory Visit
Admission: RE | Admit: 2022-08-03 | Discharge: 2022-08-03 | Disposition: A | Discharge status: Home / Self Care | Payer: BC Managed Care – PPO | Source: Ambulatory Visit

## 2022-08-03 DIAGNOSIS — Z9289 Personal history of other medical treatment: Secondary | ICD-10-CM

## 2022-08-14 ENCOUNTER — Other Ambulatory Visit (INDEPENDENT_AMBULATORY_CARE_PROVIDER_SITE_OTHER): Payer: Self-pay | Admitting: Internal Medicine

## 2022-08-14 NOTE — Telephone Encounter (Signed)
Last refill 07/13/22  Last o/v 06/13/22  Appt. No appt scheduled    Queued up for 90 day supply, 0 refills

## 2022-08-22 ENCOUNTER — Encounter (INDEPENDENT_AMBULATORY_CARE_PROVIDER_SITE_OTHER): Payer: Self-pay | Admitting: Internal Medicine

## 2022-08-24 ENCOUNTER — Telehealth (INDEPENDENT_AMBULATORY_CARE_PROVIDER_SITE_OTHER): Payer: BC Managed Care – PPO | Admitting: Internal Medicine

## 2022-09-04 ENCOUNTER — Other Ambulatory Visit: Payer: Self-pay | Admitting: Family Medicine

## 2022-09-04 DIAGNOSIS — F418 Other specified anxiety disorders: Secondary | ICD-10-CM

## 2022-09-12 ENCOUNTER — Other Ambulatory Visit: Payer: Self-pay | Admitting: Family Medicine

## 2022-09-12 ENCOUNTER — Other Ambulatory Visit (INDEPENDENT_AMBULATORY_CARE_PROVIDER_SITE_OTHER): Payer: Self-pay | Admitting: Internal Medicine

## 2022-09-13 ENCOUNTER — Encounter (INDEPENDENT_AMBULATORY_CARE_PROVIDER_SITE_OTHER): Payer: Self-pay | Admitting: Internal Medicine

## 2022-09-13 ENCOUNTER — Other Ambulatory Visit (INDEPENDENT_AMBULATORY_CARE_PROVIDER_SITE_OTHER): Payer: Self-pay | Admitting: Internal Medicine

## 2022-09-13 DIAGNOSIS — R053 Chronic cough: Secondary | ICD-10-CM

## 2022-09-13 NOTE — Telephone Encounter (Signed)
Last refill 08/03/22  Last o/v 06/13/22  Appt. No appt scheduled    Queued up for 90 day supply, 0 refills   This will be pt 3rd refill

## 2022-09-13 NOTE — Telephone Encounter (Signed)
Last appt was on 06/13/22 and has no upcoming appt.

## 2022-09-14 ENCOUNTER — Telehealth (INDEPENDENT_AMBULATORY_CARE_PROVIDER_SITE_OTHER): Payer: Self-pay | Admitting: Internal Medicine

## 2022-09-14 NOTE — Telephone Encounter (Signed)
Pt is calling to get an explanation as to why her refill request for gabapentin (NEURONTIN) 100 MG capsule was denied. Pt is out of medication.

## 2022-09-18 NOTE — Telephone Encounter (Signed)
Pt scheduled an appointment for 09/19/22 at 1:15pm video visit.

## 2022-09-19 ENCOUNTER — Ambulatory Visit (INDEPENDENT_AMBULATORY_CARE_PROVIDER_SITE_OTHER): Payer: BC Managed Care – PPO | Admitting: Emergency Medicine

## 2022-09-19 ENCOUNTER — Ambulatory Visit (INDEPENDENT_AMBULATORY_CARE_PROVIDER_SITE_OTHER): Payer: BC Managed Care – PPO | Admitting: Family Medicine

## 2022-09-19 ENCOUNTER — Telehealth (INDEPENDENT_AMBULATORY_CARE_PROVIDER_SITE_OTHER): Payer: BC Managed Care – PPO | Admitting: Internal Medicine

## 2022-09-19 ENCOUNTER — Encounter (INDEPENDENT_AMBULATORY_CARE_PROVIDER_SITE_OTHER): Payer: Self-pay

## 2022-09-19 ENCOUNTER — Encounter (INDEPENDENT_AMBULATORY_CARE_PROVIDER_SITE_OTHER): Payer: Self-pay | Admitting: Internal Medicine

## 2022-09-19 VITALS — BP 136/72 | HR 73 | Temp 97.8°F | Resp 18 | Ht 65.0 in | Wt 204.0 lb

## 2022-09-19 DIAGNOSIS — H6123 Impacted cerumen, bilateral: Secondary | ICD-10-CM

## 2022-09-19 DIAGNOSIS — G629 Polyneuropathy, unspecified: Secondary | ICD-10-CM

## 2022-09-19 NOTE — Progress Notes (Signed)
[facility]                    Subjective:      Patient ID: Donna Andrews is a 62 y.o. female.  Attending Physician: Genia Del, MD    Chief Complaint:  Chief Complaint   Patient presents with    Gabapentin     Neuropathy of throat - She was diagnosed by ENT after persistent cough following covid 19. She takes gabapentin 100 mg capsule (2 in the morning and 1 in the evening). She requests for a refill today.     She has been having clogged ears for 3 weeks after she had covid recently. She has been having hearing difficulty. No pain. She is sneezing. Not much cough or runny nose. No fever or chills. She will return for in-person visit to evaluate her ears.       Problem List:  Patient Active Problem List   Diagnosis    Situational anxiety    Hyperlipidemia    Gastroesophageal reflux disease    Cough, persistent    Aortic calcification    Breast cancer    Mild persistent asthma        Current Medications:  No outpatient medications have been marked as taking for the 09/19/22 encounter (Appointment) with Jailen Lung, Belva Agee, MD.         The following sections were reviewed this encounter by the provider:   Tobacco  Allergies  Meds  Problems  Med Hx  Surg Hx  Fam Hx             ROS:  Review of Systems   Constitutional:  Negative for chills and fever.   Respiratory:  Positive for cough. Negative for shortness of breath.    Cardiovascular:  Negative for chest pain.   Gastrointestinal:  Negative for abdominal pain, diarrhea, nausea and vomiting.   Neurological:  Positive for sensory change. Negative for dizziness and headaches.   Psychiatric/Behavioral:  Negative for depression.           Objective:     Vitals:  There were no vitals taken for this visit.    Physical Exam:  Physical Exam  Constitutional:       General: She is not in acute distress.  HENT:      Head: Normocephalic and atraumatic.   Pulmonary:      Effort: Pulmonary effort is normal. No respiratory distress.    Musculoskeletal:      Cervical back: Normal range of motion and neck supple.   Neurological:      Mental Status: She is alert and oriented to person, place, and time.         Assessment/Plan   Donna Andrews was seen today for gabapentin.    Diagnoses and all orders for this visit:    Neuropathy - Neuropathy of throat causing constant chronic cough. It has been stable on gabapentin.   - PDMP reviewed and appropriate.   - Continue gabapentin.    Genia Del, MD

## 2022-09-19 NOTE — Progress Notes (Signed)
Donna Andrews  CARE  PROGRESS NOTE     Patient: Donna Andrews   Date: 09/19/2022   MRN: [id number]       [name] is a 62 y.o. female      HISTORY     History obtained from: Patient    Chief Complaint   Patient presents with    Ear Fullness     Pt c/o clogged ears x 2-3 weeks         HPI     62 yo female presents with ears feeling plugged and hearing somewhat decreased.  Admits to using Q-tips.  No other ongoing symptoms or complaints    Review of Systems    History:    Pertinent Past Medical, Surgical, Family and Social History were reviewed.        Current Outpatient Medications:     atorvastatin (LIPITOR) 80 MG tablet, Take 1 tablet (80 mg) by mouth daily, Disp: , Rfl:     buPROPion XL (Wellbutrin XL) 300 MG 24 hr tablet, Take 1 tablet (300 mg) by mouth daily, Disp: 90 tablet, Rfl: 2    Calcium Citrate-Vitamin D 315-5 MG-MCG Tab, Take by mouth, Disp: , Rfl:     famotidine (PEPCID) 20 MG tablet, TAKE 1 TABLET DAILY AS NEEDED FOR REFLUX., Disp: , Rfl:     gabapentin (NEURONTIN) 100 MG capsule, TAKE 1 CAPSULE BY MOUTH THREE TIMES A DAY, Disp: 90 capsule, Rfl: 0    magnesium 30 MG tablet, Take 1 tablet (30 mg) by mouth 2 (two) times daily, Disp: , Rfl:     traZODone (DESYREL) 50 MG tablet, TAKE 1/2-1 TABLETS (25-50 MG TOTAL) BY MOUTH AT BEDTIME AS NEEDED FOR SLEEP., Disp: , Rfl:     No Known Allergies    Medications and Allergies reviewed.    PHYSICAL EXAM     Vitals:    09/19/22 1447   BP: 136/72   Pulse: 73   Resp: 18   Temp: 97.8 F (36.6 C)   TempSrc: Oral   SpO2: 97%   Weight: 92.5 kg (204 lb)   Height: 1.651 m (5\' 5" )       Physical Exam  Constitutional:       Appearance: Normal appearance.   HENT:      Right Ear: There is impacted cerumen.      Left Ear: There is impacted cerumen.   Neurological:      General: No focal deficit present.      Mental Status: She is alert.   Psychiatric:         Mood and Affect: Mood normal.   Vitals and nursing note reviewed.         UCC COURSE     There were no labs  reviewed with this patient during the visit.    There were no x-rays reviewed with this patient during the visit.    No current facility-administered medications for this visit.       PROCEDURES     Ear Cerumen Removal    Date/Time: 09/19/2022 2:50 PM    Performed by: Kathi Simpers, MD  Authorized by: Kathi Simpers, MD    Consent:     Consent obtained:  Verbal    Consent given by:  Patient  Local anesthetic:  None  Location details:  Right ear and left ear  Procedure type: irrigation    Ear canal:  Complete impaction  Irrigation:  Clinical staff performed  Post Procedure: TM and ear canal  visualized        MEDICAL DECISION MAKING     History, physical, labs/studies most consistent with impacted cerumen bilateral as the diagnosis.    Chart Review:  Prior PCP, Specialist and/or ED notes reviewed today: Not Applicable  Prior labs/images/studies reviewed today: Not Applicable    Differential Diagnosis:       ASSESSMENT     Encounter Diagnosis   Name Primary?    Bilateral impacted cerumen Yes            PLAN      PLAN:   62 year old female presents the clinic with ears feeling plugged and decreased hearing for the past few weeks.  Found to have bilateral cerumen impaction, cleared with lavage.  Reexamination tympanic membrane unremarkable.  OTC recommendations per written AVS.  Follow-up as needed      Orders Placed This Encounter   Procedures    Ear Cerumen Removal     Requested Prescriptions      No prescriptions requested or ordered in this encounter       Discussed results and diagnosis with patient/family.  Reviewed warning signs for worsening condition, as well as, indications for follow-up with primary care physician and return to [facility].   Patient/family expressed understanding of instructions.     An After Visit Summary was provided to the patient.

## 2022-09-19 NOTE — Progress Notes (Signed)
Have you seen any specialist/other providers since your last visit with [place]?  No    Are you located in the state of [place]?  Yes    Do you consent to this telemedicine visit?  Yes      Health Maintenance Due   Topic Date Due    Cervical Cancer Screening Three Years  Never done    Advance Directive on File  Never done

## 2022-09-30 ENCOUNTER — Other Ambulatory Visit: Payer: Self-pay | Admitting: Family Medicine

## 2022-09-30 DIAGNOSIS — F418 Other specified anxiety disorders: Secondary | ICD-10-CM

## 2022-10-04 ENCOUNTER — Other Ambulatory Visit (INDEPENDENT_AMBULATORY_CARE_PROVIDER_SITE_OTHER): Payer: Self-pay | Admitting: Internal Medicine

## 2022-10-04 DIAGNOSIS — F32A Depression, unspecified: Secondary | ICD-10-CM

## 2022-10-04 NOTE — Telephone Encounter (Signed)
Last refill 01/22/22  Last o/v 09/19/22  Appt. No appt scheduled    Sent 30 day supply, 2 refills

## 2022-10-20 ENCOUNTER — Other Ambulatory Visit (INDEPENDENT_AMBULATORY_CARE_PROVIDER_SITE_OTHER): Payer: Self-pay | Admitting: Internal Medicine

## 2022-10-22 MED ORDER — GABAPENTIN 100 MG PO CAPS
100.0000 mg | ORAL_CAPSULE | Freq: Three times a day (TID) | ORAL | 0 refills | Status: DC
Start: 2022-10-22 — End: 2022-11-22

## 2022-10-22 NOTE — Telephone Encounter (Signed)
Last refill 09/18/22  Last o/v 09/19/22  Appt. No appt scheduled    Queued up for 30 day supply, 0 refills

## 2022-11-21 ENCOUNTER — Other Ambulatory Visit (INDEPENDENT_AMBULATORY_CARE_PROVIDER_SITE_OTHER): Payer: Self-pay | Admitting: Internal Medicine

## 2022-11-21 DIAGNOSIS — R053 Chronic cough: Secondary | ICD-10-CM

## 2022-11-21 NOTE — Telephone Encounter (Signed)
Left vm per disclosure for patient to make an appt with Dr. Bonney Roussel for med refill.

## 2022-11-22 ENCOUNTER — Ambulatory Visit (INDEPENDENT_AMBULATORY_CARE_PROVIDER_SITE_OTHER): Payer: BC Managed Care – PPO | Admitting: Family Medicine

## 2022-11-22 ENCOUNTER — Encounter (INDEPENDENT_AMBULATORY_CARE_PROVIDER_SITE_OTHER): Payer: Self-pay | Admitting: Family Medicine

## 2022-11-22 DIAGNOSIS — Z6835 Body mass index (BMI) 35.0-35.9, adult: Secondary | ICD-10-CM

## 2022-11-22 DIAGNOSIS — R053 Chronic cough: Secondary | ICD-10-CM

## 2022-11-22 DIAGNOSIS — I700 Atherosclerosis of aorta: Secondary | ICD-10-CM

## 2022-11-22 MED ORDER — SEMAGLUTIDE-WEIGHT MANAGEMENT 0.25 MG/0.5ML SC SOAJ
0.2500 mg | SUBCUTANEOUS | 0 refills | Status: AC
Start: 2022-11-22 — End: 2022-12-14

## 2022-11-22 MED ORDER — GABAPENTIN 100 MG PO CAPS
100.0000 mg | ORAL_CAPSULE | Freq: Three times a day (TID) | ORAL | 0 refills | Status: DC
Start: 2022-11-22 — End: 2023-02-25

## 2022-11-22 NOTE — Progress Notes (Signed)
[facility] VISIT           Donna Andrews  is a 62 y.o.  female.  HPI  62 yo female here to f/u on neuropathy.   Takes gabapentin 100 mg TID for this. In vocal cord region causing cough at times.      Obesity:  Body mass index is 35.24 kg/m.  - Obesity Class I (BMI 30-34.9)  - Pt's weight has remained stable compared to most recent documented weights.  - Counseled on exercise including obtaining at least 150 minutes of aerobic exercise per week and eating a healthy well balanced diet.  - Weight loss and lifestyle modification strategies discussed with patient.  - Behavioral strategies discussed -self monitoring, tracking dietary intake, identify food triggers, stimulus control, mental restructuring.  - Dietary interventions discussed - calorie reduction, healthy diet choices with an emphasis on plant-based foods, lean proteins, and reducing high-carbohydrate foods.  - Informal exercise measures discussed, e.g. taking stairs instead of elevator, obtaining 5K-10K steps per day.  - Regular aerobic exercise program discussed - goal 150 mins of aerobic exercise per week.  - Reviewed weight-loss medication options with patient.  - Reviewed bariatric surgery options with patient.    Diet  Breakfast - 1 scrambled egg with cheese on bread. Black coffee  Lunch - hamburger for lunch, shared fries. Water to drink  Dinner - Spiralized zucchini with noodles with tomato and basil and parm cheese  Glass of wine with dinner  Exercise - rides bike - 5 miles.   Surveyor, mining - active  Gets 7k-8k steps     Review of Systems     BP 157/80 (BP Site: Right arm, Patient Position: Sitting, Cuff Size: Medium)   Pulse 66   Temp 97.7 F (36.5 C)   Ht 1.661 m (5' 5.4")   Wt 97.3 kg (214 lb 6.4 oz)   SpO2 99%   BMI 35.24 kg/m    Physical Exam  Constitutional:       Appearance: Normal appearance. She is obese.   Cardiovascular:      Rate and Rhythm: Normal rate and regular rhythm.      Pulses: Normal pulses.       Heart sounds: Normal heart sounds. No murmur heard.     No friction rub. No gallop.   Neurological:      Mental Status: She is alert.   Psychiatric:         Mood and Affect: Mood normal.         Behavior: Behavior normal.         Thought Content: Thought content normal.         Judgment: Judgment normal.         1. Class 2 severe obesity due to excess calories with serious comorbidity and body mass index (BMI) of 35.0 to 35.9 in adult  Fu in 1 month   - semaglutide [facility]) 0.25 MG/0.5ML injection; Inject 0.5 mLs (0.25 mg) into the skin once a week for 4 doses  Dispense: 2 mL; Refill: 0    2. Cough, persistent    - gabapentin (NEURONTIN) 100 MG capsule; Take 1 capsule (100 mg) by mouth 3 (three) times daily  Dispense: 270 capsule; Refill: 0    3. Aortic calcification  statin

## 2022-11-22 NOTE — Progress Notes (Signed)
Have you seen any specialists/other providers since your last visit with us?    No     Arm preference verified?   No    Health Maintenance Due   Topic Date Due    Cervical Cancer Screening Three Years  Never done    Advance Directive on File  Never done

## 2022-11-23 ENCOUNTER — Encounter (INDEPENDENT_AMBULATORY_CARE_PROVIDER_SITE_OTHER): Payer: Self-pay | Admitting: Family Medicine

## 2022-11-28 ENCOUNTER — Telehealth (INDEPENDENT_AMBULATORY_CARE_PROVIDER_SITE_OTHER): Payer: Self-pay | Admitting: Internal Medicine

## 2022-11-28 NOTE — Telephone Encounter (Signed)
Pt calling asking for [facility][phone] to be contacted with update on PA for wegovy; please advise

## 2022-12-02 ENCOUNTER — Other Ambulatory Visit: Payer: Self-pay | Admitting: Family Medicine

## 2022-12-02 DIAGNOSIS — F418 Other specified anxiety disorders: Secondary | ICD-10-CM

## 2022-12-03 NOTE — Telephone Encounter (Signed)
Called the patient and they moved a year ago and no longer are needing PCP services as PCP. Will deny this refill.

## 2022-12-03 NOTE — Telephone Encounter (Signed)
Last OV--08/18/2021 Patient has moved to [place]

## 2022-12-27 ENCOUNTER — Ambulatory Visit (INDEPENDENT_AMBULATORY_CARE_PROVIDER_SITE_OTHER): Payer: BC Managed Care – PPO | Admitting: Family Medicine

## 2022-12-27 VITALS — BP 118/76 | HR 65 | Temp 97.7°F | Resp 16 | Ht 65.4 in | Wt 211.6 lb

## 2022-12-27 DIAGNOSIS — E6609 Other obesity due to excess calories: Secondary | ICD-10-CM

## 2022-12-27 DIAGNOSIS — Z6834 Body mass index (BMI) 34.0-34.9, adult: Secondary | ICD-10-CM

## 2022-12-27 DIAGNOSIS — E782 Mixed hyperlipidemia: Secondary | ICD-10-CM

## 2022-12-27 LAB — LIPID PANEL
Cholesterol / HDL Ratio: 2.4 Index
Cholesterol: 189 mg/dL (ref <=199)
HDL: 78 mg/dL (ref >=40)
LDL Calculated: 88 mg/dL (ref 0–129)
Triglycerides: 114 mg/dL (ref 34–149)
VLDL Calculated: 23 mg/dL (ref 10–40)

## 2022-12-27 MED ORDER — SEMAGLUTIDE-WEIGHT MANAGEMENT 0.5 MG/0.5ML SC SOAJ
0.5000 mg | SUBCUTANEOUS | 2 refills | Status: DC
Start: 2022-12-27 — End: 2023-02-01

## 2022-12-27 MED ORDER — SEMAGLUTIDE-WEIGHT MANAGEMENT 0.5 MG/0.5ML SC SOAJ
0.5000 mg | SUBCUTANEOUS | 2 refills | Status: DC
Start: 2022-12-27 — End: 2022-12-27

## 2022-12-27 NOTE — Progress Notes (Signed)
[facility]   OFFICE VISIT            HPI   Chief Complaint   Patient presents with    Weight Check     Follow up.Marland Kitchen      HPI    Obesity:  Body mass index is 34.78 kg/m.  - Obesity Class I (BMI 30-34.9)  - Pt's weight has decreased compared to most recent documented weights.  - Counseled on exercise including obtaining at least 150 minutes of aerobic exercise per week and eating a healthy well balanced diet.  - Weight loss and lifestyle modification strategies discussed with patient.  - Behavioral strategies discussed -self monitoring, tracking dietary intake, identify food triggers, stimulus control, mental restructuring.  - Dietary interventions discussed - calorie reduction, healthy diet choices with an emphasis on plant-based foods, lean proteins, and reducing high-carbohydrate foods.  - Informal exercise measures discussed, e.g. taking stairs instead of elevator, obtaining 5K-10K steps per day.  - Regular aerobic exercise program discussed - goal 150 mins of aerobic exercise per week.  - Reviewed weight-loss medication options with patient.  - Reviewed bariatric surgery options with patient.    Patient has been riding bike for exercise    Diet Recall  No breakfast  [place] saladk  Sandwiches for dinner  2 glasses of wine  No dessert or snacking recalled.      ROS   Review of Systems      Vital Signs   BP 118/76 (BP Site: Right arm, Patient Position: Sitting, Cuff Size: Large)   Pulse 65   Temp 97.7 F (36.5 C)   Resp 16   Ht 1.661 m (5' 5.4")   Wt 96 kg (211 lb 9.6 oz)   SpO2 96%   BMI 34.78 kg/m      Physical Exam   Physical Exam  Constitutional:       Appearance: Normal appearance. She is obese.   Cardiovascular:      Rate and Rhythm: Normal rate and regular rhythm.      Pulses: Normal pulses.      Heart sounds: Normal heart sounds. No murmur heard.     No friction rub. No gallop.   Pulmonary:      Effort: Pulmonary effort is normal.      Breath sounds: Normal breath sounds.   Neurological:       Mental Status: She is alert.   Psychiatric:         Mood and Affect: Mood normal.         Behavior: Behavior normal.         Judgment: Judgment normal.           Assessment/Plan   1. Class 1 obesity due to excess calories with serious comorbidity and body mass index (BMI) of 34.0 to 34.9 in adult    - Lipid Panel; Future  - semaglutide [facility]) 0.5 MG/0.5ML injection; Inject 0.5 mLs (0.5 mg) into the skin once a week for 12 doses  Dispense: 2 mL; Refill: 2    2. Mixed hyperlipidemia    - Lipid Panel; Future  - semaglutide [facility]) 0.5 MG/0.5ML injection; Inject 0.5 mLs (0.5 mg) into the skin once a week for 12 doses  Dispense: 2 mL; Refill: 2

## 2022-12-28 NOTE — Progress Notes (Signed)
Hello Donna Andrews    Your lab results have come back normal and no follow up is needed.   Have a great day and please let us know if you have any questions.     Dr. Bonney Roussel

## 2023-01-14 ENCOUNTER — Other Ambulatory Visit (INDEPENDENT_AMBULATORY_CARE_PROVIDER_SITE_OTHER): Payer: Self-pay | Admitting: Internal Medicine

## 2023-01-14 DIAGNOSIS — F419 Anxiety disorder, unspecified: Secondary | ICD-10-CM

## 2023-01-25 ENCOUNTER — Encounter (INDEPENDENT_AMBULATORY_CARE_PROVIDER_SITE_OTHER): Payer: Self-pay | Admitting: Family Medicine

## 2023-01-29 ENCOUNTER — Telehealth: Payer: Self-pay | Admitting: Internal Medicine

## 2023-01-29 ENCOUNTER — Encounter (INDEPENDENT_AMBULATORY_CARE_PROVIDER_SITE_OTHER): Payer: Self-pay | Admitting: Family Medicine

## 2023-01-29 DIAGNOSIS — Z6834 Body mass index (BMI) 34.0-34.9, adult: Secondary | ICD-10-CM | POA: Insufficient documentation

## 2023-01-29 NOTE — Telephone Encounter (Signed)
Pt is asking a Nurse to give her a call back regarding the status on her PA for semaglutide [facility]) 0.5 MG/0.5ML injection.    Callback number [phone].    Please advise.

## 2023-01-30 NOTE — Telephone Encounter (Signed)
Called pt and left vm with callback number.

## 2023-01-31 NOTE — Telephone Encounter (Signed)
PT called back to speak with staff, but writer was unable to reach FD. Please call back PT when available.

## 2023-02-01 ENCOUNTER — Ambulatory Visit (INDEPENDENT_AMBULATORY_CARE_PROVIDER_SITE_OTHER): Payer: BC Managed Care – PPO | Admitting: Family Medicine

## 2023-02-01 VITALS — BP 133/83 | HR 63 | Temp 98.3°F | Resp 14 | Ht 65.4 in | Wt 209.0 lb

## 2023-02-01 DIAGNOSIS — E6609 Other obesity due to excess calories: Secondary | ICD-10-CM

## 2023-02-01 DIAGNOSIS — Z6834 Body mass index (BMI) 34.0-34.9, adult: Secondary | ICD-10-CM

## 2023-02-01 MED ORDER — SEMAGLUTIDE-WEIGHT MANAGEMENT 1 MG/0.5ML SC SOAJ
1.0000 mg | SUBCUTANEOUS | 1 refills | Status: DC
Start: 2023-02-01 — End: 2023-02-01

## 2023-02-01 MED ORDER — SEMAGLUTIDE-WEIGHT MANAGEMENT 1 MG/0.5ML SC SOAJ
1.0000 mg | SUBCUTANEOUS | 1 refills | Status: AC
Start: 2023-02-01 — End: 2023-03-23

## 2023-02-01 NOTE — Progress Notes (Signed)
[facility]   OFFICE VISIT            HPI   Chief Complaint   Patient presents with    Weight Loss     Follow up and increase on wegovy shot.       HPI  62 yo female for f/u visit for obesity  Has lost around 5lb. On Wegovy 0.5mg  weekly  She states eating healthier some times but had great deal of guests over past 2-3 months making this difficulty  She also has 1-2 alcoholic drinks 5 nights/week.   No other caloric drinks.   Stools are no longer daily. She goes every other day.   Obesity:  Body mass index is 34.36 kg/m.  - Obesity Class I (BMI 30-34.9)  - Pt's weight has decreased compared to most recent documented weights.  - Counseled on exercise including obtaining at least 150 minutes of aerobic exercise per week and eating a healthy well balanced diet.  - Weight loss and lifestyle modification strategies discussed with patient.  - Behavioral strategies discussed -self monitoring, tracking dietary intake, identify food triggers, stimulus control, mental restructuring.  - Dietary interventions discussed - calorie reduction, healthy diet choices with an emphasis on plant-based foods, lean proteins, and reducing high-carbohydrate foods.  - Informal exercise measures discussed, e.g. taking stairs instead of elevator, obtaining 5K-10K steps per day.  - Regular aerobic exercise program discussed - goal 150 mins of aerobic exercise per week.  - Reviewed weight-loss medication options with patient.         ROS   Review of Systems      Vital Signs   BP 133/83 (BP Site: Right arm, Patient Position: Sitting, Cuff Size: Medium)   Pulse 63   Temp 98.3 F (36.8 C)   Resp 14   Ht 1.661 m (5' 5.4")   Wt 94.8 kg (209 lb)   SpO2 99%   BMI 34.36 kg/m      Physical Exam   Physical Exam  Constitutional:       Appearance: Normal appearance. She is obese.   Cardiovascular:      Rate and Rhythm: Normal rate and regular rhythm.      Pulses: Normal pulses.      Heart sounds: Normal heart sounds. No murmur heard.     No  friction rub. No gallop.   Pulmonary:      Effort: Pulmonary effort is normal. No respiratory distress.      Breath sounds: Normal breath sounds. No rales.   Neurological:      Mental Status: She is alert and oriented to person, place, and time.   Psychiatric:         Mood and Affect: Mood normal.         Behavior: Behavior normal.         Thought Content: Thought content normal.         Judgment: Judgment normal.           Assessment/Plan   1. Class 1 obesity due to excess calories with serious comorbidity and body mass index (BMI) of 34.0 to 34.9 in adult    - semaglutide [facility]) 1 MG/0.5ML injection; Inject 0.5 mLs (1 mg) into the skin once a week for 8 doses  Dispense: 2 mL; Refill: 1  >30 min spent on chart review, exam and discussion/plan formation

## 2023-02-14 ENCOUNTER — Other Ambulatory Visit (INDEPENDENT_AMBULATORY_CARE_PROVIDER_SITE_OTHER): Payer: Self-pay | Admitting: Internal Medicine

## 2023-02-14 ENCOUNTER — Encounter (INDEPENDENT_AMBULATORY_CARE_PROVIDER_SITE_OTHER): Payer: Self-pay | Admitting: Family Medicine

## 2023-02-14 DIAGNOSIS — F419 Anxiety disorder, unspecified: Secondary | ICD-10-CM

## 2023-02-23 ENCOUNTER — Other Ambulatory Visit (INDEPENDENT_AMBULATORY_CARE_PROVIDER_SITE_OTHER): Payer: Self-pay | Admitting: Family Medicine

## 2023-03-29 ENCOUNTER — Ambulatory Visit (INDEPENDENT_AMBULATORY_CARE_PROVIDER_SITE_OTHER): Payer: BC Managed Care – PPO | Admitting: Family Medicine

## 2023-03-29 VITALS — BP 104/70 | HR 71 | Temp 98.5°F | Ht 65.16 in | Wt 201.0 lb

## 2023-03-29 DIAGNOSIS — E66811 Obesity, class 1: Secondary | ICD-10-CM

## 2023-03-29 DIAGNOSIS — E6609 Other obesity due to excess calories: Secondary | ICD-10-CM

## 2023-03-29 DIAGNOSIS — C50911 Malignant neoplasm of unspecified site of right female breast: Secondary | ICD-10-CM

## 2023-03-29 DIAGNOSIS — Z6833 Body mass index (BMI) 33.0-33.9, adult: Secondary | ICD-10-CM

## 2023-03-29 MED ORDER — SEMAGLUTIDE-WEIGHT MANAGEMENT 1.7 MG/0.75ML SC SOAJ
1.7000 mg | SUBCUTANEOUS | 2 refills | Status: AC
Start: 2023-03-29 — End: 2023-04-20

## 2023-03-29 NOTE — Progress Notes (Signed)
Have you seen any specialists/other providers since your last visit with [place]?    No    Health Maintenance Due   Topic Date Due    Cervical Cancer Screening Three Years  Never done    Advance Directive on File  Never done    INFLUENZA VACCINE  01/03/2023

## 2023-03-29 NOTE — Progress Notes (Signed)
[facility]   OFFICE VISIT                  HPI   Chief Complaint   Patient presents with    Weight Management     3 MO FU      HPI    62 yo female here to f/u on medical weight loss. On wegovy 1 mg weekly for 8 weeks. Out of medication for 2-3 weeks. Has lost 8lbs during this time    Denies n/v. No abdominal pain  No diarrhea  No lightheadedness    Diet.   Protein - Intake - 90 g a day is goal.   Rare liquid caloric intake with wine  Obesity:  Body mass index is 33.29 kg/m.  - Obesity Class I (BMI 30-34.9)  - Pt's weight has decreased compared to most recent documented weights.  - Counseled on exercise including obtaining at least 150 minutes of aerobic exercise per week and eating a healthy well balanced diet.  - Weight loss and lifestyle modification strategies discussed with patient.  - Behavioral strategies discussed -self monitoring, tracking dietary intake, identify food triggers, stimulus control, mental restructuring.  - Dietary interventions discussed - calorie reduction, healthy diet choices with an emphasis on plant-based foods, lean proteins, and reducing high-carbohydrate foods.  - Informal exercise measures discussed, e.g. taking stairs instead of elevator, obtaining 5K-10K steps per day.  - Regular aerobic exercise program discussed - goal 150 mins of aerobic exercise per week.  - Reviewed weight-loss medication options with patient.       ROS   Review of Systems      Vital Signs   BP 104/70 (BP Site: Right arm, Patient Position: Sitting, Cuff Size: Large)   Pulse 71   Temp 98.5 F (36.9 C) (Oral)   Ht 1.655 m (5' 5.16")   Wt 91.2 kg (201 lb)   SpO2 98%   BMI 33.29 kg/m      Physical Exam   Physical Exam  Constitutional:       Appearance: Normal appearance. She is obese.   Cardiovascular:      Rate and Rhythm: Normal rate and regular rhythm.      Pulses: Normal pulses.      Heart sounds: Normal heart sounds. No murmur heard.     No friction rub. No gallop.   Pulmonary:      Effort:  Pulmonary effort is normal. No respiratory distress.      Breath sounds: Normal breath sounds. No wheezing or rales.   Neurological:      Mental Status: She is alert.   Psychiatric:         Mood and Affect: Mood normal.         Behavior: Behavior normal.         Thought Content: Thought content normal.         Judgment: Judgment normal.           Assessment/Plan   1. Class 1 obesity due to excess calories without serious comorbidity with body mass index (BMI) of 33.0 to 33.9 in adult (Primary)    - semaglutide [facility]) 1.7 MG/0.75ML injection; Inject 0.75 mLs (1.7 mg) into the skin once a week for 4 doses  Dispense: 3 mL; Refill: 2  - Follow Up In Primary Care; Future    2. Malignant neoplasm of right female breast, unspecified estrogen receptor status, unspecified site of breast  Needs to do yearly mammograms  S/p resection  radiation treatments.

## 2023-04-20 ENCOUNTER — Other Ambulatory Visit (INDEPENDENT_AMBULATORY_CARE_PROVIDER_SITE_OTHER): Payer: Self-pay | Admitting: Internal Medicine

## 2023-04-20 DIAGNOSIS — F32A Depression, unspecified: Secondary | ICD-10-CM

## 2023-04-22 NOTE — Telephone Encounter (Signed)
 Medication(s) requested: buPROPion XL (WELLBUTRIN XL) 300 MG 24 hr tablet   Medication(s) last refilled: Jan 14, 2023, Qty: 30, Refills: 2  Last Visit: Oct 25,2024  Patient:  Has an upcoming appointment on Jan 31,2025  Last Labs:  Lab Results   Component V

## 2023-05-18 ENCOUNTER — Other Ambulatory Visit (INDEPENDENT_AMBULATORY_CARE_PROVIDER_SITE_OTHER): Payer: Self-pay | Admitting: Family Medicine

## 2023-05-18 DIAGNOSIS — F419 Anxiety disorder, unspecified: Secondary | ICD-10-CM

## 2023-05-27 ENCOUNTER — Other Ambulatory Visit (INDEPENDENT_AMBULATORY_CARE_PROVIDER_SITE_OTHER): Payer: Self-pay | Admitting: Family Medicine

## 2023-05-27 ENCOUNTER — Other Ambulatory Visit (INDEPENDENT_AMBULATORY_CARE_PROVIDER_SITE_OTHER): Payer: Self-pay | Admitting: Internal Medicine

## 2023-05-27 DIAGNOSIS — R053 Chronic cough: Secondary | ICD-10-CM

## 2023-05-30 ENCOUNTER — Telehealth (INDEPENDENT_AMBULATORY_CARE_PROVIDER_SITE_OTHER): Payer: Self-pay | Admitting: Family Medicine

## 2023-05-30 DIAGNOSIS — R053 Chronic cough: Secondary | ICD-10-CM

## 2023-05-30 NOTE — Telephone Encounter (Signed)
 PT Pharm says they don't have any refills left on this rx. Pt says she is in pain and just needs it resolved.  Please Advise.

## 2023-06-19 ENCOUNTER — Other Ambulatory Visit (INDEPENDENT_AMBULATORY_CARE_PROVIDER_SITE_OTHER): Payer: Self-pay | Admitting: Family Medicine

## 2023-06-19 DIAGNOSIS — E6609 Other obesity due to excess calories: Secondary | ICD-10-CM

## 2023-06-28 ENCOUNTER — Ambulatory Visit (INDEPENDENT_AMBULATORY_CARE_PROVIDER_SITE_OTHER): Payer: BC Managed Care – PPO | Admitting: Family Medicine

## 2023-07-02 ENCOUNTER — Other Ambulatory Visit: Payer: Self-pay | Admitting: Family Medicine

## 2023-07-05 ENCOUNTER — Ambulatory Visit (INDEPENDENT_AMBULATORY_CARE_PROVIDER_SITE_OTHER): Payer: BC Managed Care – PPO | Admitting: Family Medicine

## 2023-07-05 ENCOUNTER — Encounter (INDEPENDENT_AMBULATORY_CARE_PROVIDER_SITE_OTHER): Payer: Self-pay | Admitting: Family Medicine

## 2023-07-05 VITALS — BP 108/70 | HR 69 | Temp 97.9°F | Resp 16 | Ht 65.0 in | Wt 188.8 lb

## 2023-07-05 DIAGNOSIS — E782 Mixed hyperlipidemia: Secondary | ICD-10-CM

## 2023-07-05 DIAGNOSIS — Z6833 Body mass index (BMI) 33.0-33.9, adult: Secondary | ICD-10-CM

## 2023-07-05 DIAGNOSIS — E6609 Other obesity due to excess calories: Secondary | ICD-10-CM

## 2023-07-05 DIAGNOSIS — Z6835 Body mass index (BMI) 35.0-35.9, adult: Secondary | ICD-10-CM

## 2023-07-05 DIAGNOSIS — Z6831 Body mass index (BMI) 31.0-31.9, adult: Secondary | ICD-10-CM

## 2023-07-05 DIAGNOSIS — E66812 Obesity, class 2: Secondary | ICD-10-CM

## 2023-07-05 DIAGNOSIS — E66811 Obesity, class 1: Secondary | ICD-10-CM

## 2023-07-05 DIAGNOSIS — I700 Atherosclerosis of aorta: Secondary | ICD-10-CM

## 2023-07-05 DIAGNOSIS — K219 Gastro-esophageal reflux disease without esophagitis: Secondary | ICD-10-CM

## 2023-07-05 LAB — COMPREHENSIVE METABOLIC PANEL
ALT: 29 U/L (ref <=55)
AST (SGOT): 24 U/L (ref <=41)
Albumin/Globulin Ratio: 1.5 (ref 0.9–2.2)
Albumin: 4 g/dL (ref 3.5–5.0)
Alkaline Phosphatase: 68 U/L (ref 37–117)
Anion Gap: 7 (ref 5.0–15.0)
BUN: 10 mg/dL (ref 7–21)
Bilirubin, Total: 0.9 mg/dL (ref 0.2–1.2)
CO2: 25 meq/L (ref 17–29)
Calcium: 9 mg/dL (ref 8.5–10.5)
Chloride: 107 meq/L (ref 99–111)
Creatinine: 0.9 mg/dL (ref 0.4–1.0)
GFR: >60 mL/min/{1.73_m2} (ref >=60.0)
Globulin: 2.6 g/dL (ref 2.0–3.6)
Glucose: 84 mg/dL (ref 70–100)
Hemolysis Index: 12 {index}
Potassium: 4.9 meq/L (ref 3.5–5.3)
Protein, Total: 6.6 g/dL (ref 6.0–8.3)
Sodium: 139 meq/L (ref 135–145)

## 2023-07-05 LAB — LIPID PANEL
Cholesterol / HDL Ratio: 2.3 {index}
Cholesterol: 173 mg/dL (ref <=199)
HDL: 75 mg/dL (ref >=40)
LDL Calculated: 84 mg/dL (ref 0–99)
Triglycerides: 68 mg/dL (ref 34–149)
VLDL Calculated: 14 mg/dL (ref 10–40)

## 2023-07-05 LAB — HEMOGLOBIN A1C
Average Estimated Glucose: 105.4 mg/dL
Hemoglobin A1C: 5.3 % (ref 4.6–5.6)

## 2023-07-05 MED ORDER — WEGOVY 1.7 MG/0.75ML SC SOAJ
1.7000 mg | SUBCUTANEOUS | 5 refills | Status: DC
Start: 2023-07-05 — End: 2023-08-08

## 2023-07-05 NOTE — Assessment & Plan Note (Signed)
 Continue atorvastatin 80 mg  Consider switch to rosuvastatin 10 mg  Orders:    Lipid Panel; Future

## 2023-07-05 NOTE — Progress Notes (Signed)
 Have you seen any specialists/other providers since your last visit with us ?    No    Health Maintenance Due   Topic Date Due    Cervical Cancer Screening Three Years  Never done    Advance Directive on File  Never done    Annual Exam  06/14/2023

## 2023-07-05 NOTE — Progress Notes (Signed)
 [facility]   OFFICE VISIT                HPI     Chief Complaint   Patient presents with    Weight Check     wegovy       HPI    63 yo female here to f/u on obesity treatment. She is on Wegovy  1.7 mg Qweekly.   No adverse effects currently. Did initially have nausea at start of 1.7 mg dosage  Has lost 12 lbs in 3 months. Starting weight of 214 lb in June 2024, she currently weights 188 lbs.         Exercise - using treadmill, walking out side when not too cold.   Diet  Breakfast - usually coffee at times with cottage cheese  Lunch - pasta bolognese  Dinner - protein + veggies  Rare snacking  Has maybe one glass of wine with dinner.   ROS   Review of Systems    Vital Signs   BP 108/70 (BP Site: Left arm, Patient Position: Sitting, Cuff Size: Large)   Pulse 69   Temp 97.9 F (36.6 C) (Oral)   Resp 16   Ht 1.651 m (5' 5)   Wt 85.6 kg (188 lb 12.8 oz)   SpO2 98%   BMI 31.42 kg/m     Physical Exam   Physical Exam  Constitutional:       Appearance: Normal appearance. She is obese.   Neurological:      Mental Status: She is alert and oriented to person, place, and time.   Psychiatric:         Mood and Affect: Mood normal.         Behavior: Behavior normal.         Thought Content: Thought content normal.         Judgment: Judgment normal.         Assessment/Plan     Assessment & Plan  Mixed hyperlipidemia  Continue atorvastatin  80 mg  Consider switch to rosuvastatin 10 mg  Orders:    Lipid Panel; Future    Class 2 severe obesity due to excess calories with serious comorbidity and body mass index (BMI) of 35.0 to 35.9 in adult    Orders:    Wegovy  1.7 MG/0.75ML injection; Inject 0.75 mLs (1.7 mg) into the skin once a week    BMI 31.0-31.9,adult    Orders:    Follow Up In Primary Care    Wegovy  1.7 MG/0.75ML injection; Inject 0.75 mLs (1.7 mg) into the skin once a week    Lipid Panel; Future    Comprehensive Metabolic Panel; Future    Hemoglobin A1C; Future    Aortic calcification  Statin to continue        Gastroesophageal reflux disease, unspecified whether esophagitis present  Symptoms well controlled even with GLP1

## 2023-07-05 NOTE — Assessment & Plan Note (Signed)
Statin to continue

## 2023-07-05 NOTE — Assessment & Plan Note (Signed)
 Orders:    Wegovy 1.7 MG/0.75ML injection; Inject 0.75 mLs (1.7 mg) into the skin once a week

## 2023-07-05 NOTE — Assessment & Plan Note (Signed)
 Symptoms well controlled even with GLP1

## 2023-07-06 ENCOUNTER — Encounter (INDEPENDENT_AMBULATORY_CARE_PROVIDER_SITE_OTHER): Payer: Self-pay | Admitting: Family Medicine

## 2023-07-06 ENCOUNTER — Other Ambulatory Visit (INDEPENDENT_AMBULATORY_CARE_PROVIDER_SITE_OTHER): Payer: Self-pay | Admitting: Family Medicine

## 2023-07-08 ENCOUNTER — Encounter (INDEPENDENT_AMBULATORY_CARE_PROVIDER_SITE_OTHER): Payer: Self-pay | Admitting: Family Medicine

## 2023-07-09 ENCOUNTER — Other Ambulatory Visit (INDEPENDENT_AMBULATORY_CARE_PROVIDER_SITE_OTHER): Payer: Self-pay | Admitting: Family Medicine

## 2023-07-09 DIAGNOSIS — E7800 Pure hypercholesterolemia, unspecified: Secondary | ICD-10-CM

## 2023-07-09 MED ORDER — ATORVASTATIN CALCIUM 40 MG PO TABS: 40.0000 mg | ORAL_TABLET | Freq: Every day | ORAL | 3 refills | Status: DC

## 2023-07-09 NOTE — Progress Notes (Signed)
Hello Donna Andrews    Your lab results have come back with no concerning findings. Have a great day!     Dr. Bonney Roussel

## 2023-07-24 ENCOUNTER — Other Ambulatory Visit: Payer: Self-pay | Admitting: Family Medicine

## 2023-07-28 ENCOUNTER — Other Ambulatory Visit: Payer: Self-pay | Admitting: Family Medicine

## 2023-07-30 ENCOUNTER — Other Ambulatory Visit (INDEPENDENT_AMBULATORY_CARE_PROVIDER_SITE_OTHER): Payer: Self-pay | Admitting: Family Medicine

## 2023-07-30 DIAGNOSIS — Z1231 Encounter for screening mammogram for malignant neoplasm of breast: Secondary | ICD-10-CM

## 2023-08-01 ENCOUNTER — Other Ambulatory Visit (INDEPENDENT_AMBULATORY_CARE_PROVIDER_SITE_OTHER): Payer: Self-pay | Admitting: Family Medicine

## 2023-08-01 DIAGNOSIS — F419 Anxiety disorder, unspecified: Secondary | ICD-10-CM

## 2023-08-05 ENCOUNTER — Other Ambulatory Visit (INDEPENDENT_AMBULATORY_CARE_PROVIDER_SITE_OTHER): Payer: Self-pay | Admitting: Family Medicine

## 2023-08-05 ENCOUNTER — Encounter (INDEPENDENT_AMBULATORY_CARE_PROVIDER_SITE_OTHER): Payer: Self-pay | Admitting: Family Medicine

## 2023-08-05 DIAGNOSIS — F419 Anxiety disorder, unspecified: Secondary | ICD-10-CM

## 2023-08-06 ENCOUNTER — Ambulatory Visit
Admission: RE | Admit: 2023-08-06 | Discharge: 2023-08-06 | Disposition: A | Discharge status: Home / Self Care | Payer: BC Managed Care – PPO | Source: Ambulatory Visit | Attending: Family Medicine | Admitting: Family Medicine

## 2023-08-06 ENCOUNTER — Encounter (INDEPENDENT_AMBULATORY_CARE_PROVIDER_SITE_OTHER): Payer: Self-pay | Admitting: Family Medicine

## 2023-08-06 DIAGNOSIS — Z1231 Encounter for screening mammogram for malignant neoplasm of breast: Secondary | ICD-10-CM | POA: Insufficient documentation

## 2023-08-08 ENCOUNTER — Other Ambulatory Visit (INDEPENDENT_AMBULATORY_CARE_PROVIDER_SITE_OTHER): Payer: Self-pay | Admitting: Family Medicine

## 2023-08-08 DIAGNOSIS — E782 Mixed hyperlipidemia: Secondary | ICD-10-CM

## 2023-08-08 MED ORDER — SEMAGLUTIDE-WEIGHT MANAGEMENT 2.4 MG/0.75ML SC SOAJ
2.4000 mg | SUBCUTANEOUS | 2 refills | Status: AC
Start: 2023-08-08 — End: 2023-10-25

## 2023-09-04 ENCOUNTER — Other Ambulatory Visit (INDEPENDENT_AMBULATORY_CARE_PROVIDER_SITE_OTHER): Payer: Self-pay | Admitting: Family Medicine

## 2023-09-04 DIAGNOSIS — R053 Chronic cough: Secondary | ICD-10-CM

## 2023-09-30 ENCOUNTER — Other Ambulatory Visit (INDEPENDENT_AMBULATORY_CARE_PROVIDER_SITE_OTHER): Payer: Self-pay | Admitting: Family Medicine

## 2023-10-02 ENCOUNTER — Ambulatory Visit (INDEPENDENT_AMBULATORY_CARE_PROVIDER_SITE_OTHER): Payer: BC Managed Care – PPO | Admitting: Family Medicine

## 2023-10-02 ENCOUNTER — Encounter (INDEPENDENT_AMBULATORY_CARE_PROVIDER_SITE_OTHER): Payer: Self-pay | Admitting: Family Medicine

## 2023-10-02 VITALS — BP 116/82 | HR 72 | Temp 97.5°F | Resp 16 | Ht 65.5 in | Wt 176.0 lb

## 2023-10-02 DIAGNOSIS — Z0000 Encounter for general adult medical examination without abnormal findings: Secondary | ICD-10-CM

## 2023-10-02 DIAGNOSIS — F419 Anxiety disorder, unspecified: Secondary | ICD-10-CM

## 2023-10-02 DIAGNOSIS — F32A Depression, unspecified: Secondary | ICD-10-CM

## 2023-10-02 LAB — HEMOGLOBIN A1C
Average Estimated Glucose: 102.5 mg/dL
Hemoglobin A1C: 5.2 % (ref 4.6–5.6)

## 2023-10-02 LAB — CBC
Absolute nRBC: 0 10*3/uL (ref <=0.00)
Hematocrit: 41 % (ref 34.7–43.7)
Hemoglobin: 13.6 g/dL (ref 11.4–14.8)
MCH: 31.3 pg (ref 25.1–33.5)
MCHC: 33.2 g/dL (ref 31.5–35.8)
MCV: 94.3 fL (ref 78.0–96.0)
MPV: 11.1 fL (ref 8.9–12.5)
Platelet Count: 280 10*3/uL (ref 142–346)
RBC: 4.35 10*6/uL (ref 3.90–5.10)
RDW: 14 % (ref 11–15)
WBC: 6.71 10*3/uL (ref 3.10–9.50)
nRBC %: 0 /100{WBCs} (ref <=0.0)

## 2023-10-02 LAB — LIPID PANEL
Cholesterol / HDL Ratio: 2.6 {index}
Cholesterol: 194 mg/dL (ref <=199)
HDL: 76 mg/dL (ref >=40)
LDL Calculated: 102 mg/dL — ABNORMAL HIGH (ref 0–99)
Triglycerides: 78 mg/dL (ref 34–149)
VLDL Calculated: 16 mg/dL (ref 10–40)

## 2023-10-02 LAB — COMPREHENSIVE METABOLIC PANEL
ALT: 22 U/L (ref <=55)
AST (SGOT): 21 U/L (ref <=41)
Albumin/Globulin Ratio: 1.3 (ref 0.9–2.2)
Albumin: 3.9 g/dL (ref 3.5–5.0)
Alkaline Phosphatase: 60 U/L (ref 37–117)
Anion Gap: 11 (ref 5.0–15.0)
BUN: 12 mg/dL (ref 7–21)
Bilirubin, Total: 0.5 mg/dL (ref 0.2–1.2)
CO2: 24 meq/L (ref 17–29)
Calcium: 9.2 mg/dL (ref 8.5–10.5)
Chloride: 105 meq/L (ref 99–111)
Creatinine: 0.8 mg/dL (ref 0.4–1.0)
GFR: >60 mL/min/{1.73_m2} (ref >=60.0)
Globulin: 2.9 g/dL (ref 2.0–3.6)
Glucose: 87 mg/dL (ref 70–100)
Hemolysis Index: 8 {index}
Potassium: 4.8 meq/L (ref 3.5–5.3)
Protein, Total: 6.8 g/dL (ref 6.0–8.3)
Sodium: 140 meq/L (ref 135–145)

## 2023-10-02 LAB — THYROID STIMULATING HORMONE (TSH) WITH REFLEX TO FREE T4: TSH: 1 u[IU]/mL (ref 0.35–4.94)

## 2023-10-02 MED ORDER — BUPROPION HCL ER (XL) 300 MG PO TB24
300.0000 mg | ORAL_TABLET | Freq: Every day | ORAL | 1 refills | Status: DC
Start: 2023-10-02 — End: 2024-04-21

## 2023-10-02 NOTE — Progress Notes (Signed)
 Have you seen any specialists/other providers since your last visit with us ?    No    Health Maintenance Due   Topic Date Due    Cervical Cancer Screening Three Years  Never done    Advance Directive on File  Never done    Pneumonia Vaccine Age 64 Years and Older (1 of 2 - PCV) Never done    Annual Exam  06/14/2023

## 2023-10-02 NOTE — Progress Notes (Signed)
 ANNUAL PHYSICAL EXAM                                                                         HPI: Patient is a 63 y.o. female who presents today for a complete physical exam.    Current Concerns: has lost 30+ lbs on wegovy . On maximum dosage for 2 months. Does not report any concerning side effects.     Reported Health: good health  Diet: compliant with recommended diet  Exercise:  could do better. Walking regularly 30 min daily.   Body mass index is 28.84 kg/m.    Dental: dentist visit within 6-12 months  Vision: glasses and regular eye exams   Hearing: normal hearing    Sleep: does not have trouble falling asleep or staying asleep, average of 6+ hours nightly.  Depression Screen: Little interest or pleasure in doing things: 0 (10/02/2023  9:31 AM)  Feeling down, depressed, or hopeless: 0 (10/02/2023  9:31 AM)  Trouble falling or staying asleep, or sleeping too much: 1 (10/02/2023  9:31 AM)  Feeling tired or having little energy: 1 (10/02/2023  9:31 AM)  Poor appetite or overeating: 0 (10/02/2023  9:31 AM)  Feeling bad about yourself - or that you are a failure or have let yourself or your family down: 0 (10/02/2023  9:31 AM)  Trouble concentrating on things, such as reading the newspaper or watching television: 0 (10/02/2023  9:31 AM)  Moving or speaking so slowly that other people could have noticed. Or the opposite - being so fidgety or restless that you have been moving around a lot more than usual: 0 (10/02/2023  9:31 AM)  Thoughts that you would be better off dead, or of hurting yourself in some way: 0 (10/02/2023  9:31 AM)  PHQ Total Score: 2 (10/02/2023  9:31 AM)  If you checked off any problems, how difficult have these problems made it for you to do your work, take care of things at home, or get along with other people?: Not difficult at all (10/02/2023  9:31 AM)        Social:  Lives with: husband. Safe and comfortable environment.   Work: works for [employer]  Tobacco: no  Alcohol: 1/day  Illicit Drug  Use: No      Reproductive Health: not currently sexually active  Safety Elements Used: uses seat belts    Health Maintenance:  Immunization Status: immunizations up to date  Prior Screening Tests: last colonoscopy in 2022  General Health Risks: no family history of colon cancer and no family history of breast cancer  Colonoscopy: done in 2022  Mammogram: done in March 2025    Past Medical History:   Diagnosis Date    Breast cancer (CMS/HCC)     Hyperlipidemia        Past Surgical History:   Procedure Laterality Date    BREAST BIOPSY Right     2016    BREAST SURGERY Right     lumpectomy 2016    CESAREAN SECTION      HYSTERECTOMY         @HXFAMILY @    Allergies: Patient has no known allergies.    Medications:  Current Outpatient Medications   Medication Sig  Dispense Refill    atorvastatin  (LIPITOR) 40 MG tablet Take 1 tablet (40 mg) by mouth daily 90 tablet 3    buPROPion  XL (WELLBUTRIN  XL) 300 MG 24 hr tablet TAKE 1 TABLET BY MOUTH EVERY DAY 30 tablet 2    Calcium  Citrate-Vitamin D 315-5 MG-MCG Tab Take by mouth      famotidine (PEPCID) 20 MG tablet TAKE 1 TABLET DAILY AS NEEDED FOR REFLUX. 30 tablet 2    magnesium 30 MG tablet Take 1 tablet (30 mg) by mouth 2 (two) times daily      semaglutide  (Wegovy ) 2.4 MG/0.75ML injection Inject 0.75 mLs (2.4 mg) into the skin once a week for 12 doses 3 mL 2     No current facility-administered medications for this visit.       Social History:  Patient  reports that she has never smoked. She has never used smokeless tobacco. She reports current alcohol use. She reports that she does not use drugs.      ROS:  ROS      Objective:   BP 116/82 (BP Site: Left arm, Patient Position: Sitting, Cuff Size: Large)   Pulse 72   Temp 97.5 F (36.4 C) (Temporal)   Resp 16   Ht 1.664 m (5' 5.5")   Wt 79.8 kg (176 lb)   SpO2 98%   BMI 28.84 kg/m     Examination:   Physical Exam  Vitals and nursing note reviewed.   Constitutional:       Appearance: Normal appearance. She is normal  weight.   HENT:      Head: Normocephalic and atraumatic.      Right Ear: Tympanic membrane, ear canal and external ear normal.      Left Ear: Tympanic membrane, ear canal and external ear normal.      Mouth/Throat:      Mouth: Mucous membranes are moist.   Eyes:      Extraocular Movements: Extraocular movements intact.      Conjunctiva/sclera: Conjunctivae normal.      Pupils: Pupils are equal, round, and reactive to light.   Cardiovascular:      Rate and Rhythm: Normal rate and regular rhythm.      Pulses: Normal pulses.      Heart sounds: Normal heart sounds. No murmur heard.     No friction rub. No gallop.   Pulmonary:      Effort: Pulmonary effort is normal. No respiratory distress.      Breath sounds: Normal breath sounds. No stridor. No wheezing, rhonchi or rales.   Chest:      Chest wall: No tenderness.   Abdominal:      General: Abdomen is flat. Bowel sounds are normal.      Palpations: Abdomen is soft.   Musculoskeletal:         General: Normal range of motion.   Skin:     General: Skin is warm.   Neurological:      General: No focal deficit present.      Mental Status: She is alert and oriented to person, place, and time. Mental status is at baseline.   Psychiatric:         Mood and Affect: Mood normal.         Behavior: Behavior normal.         Thought Content: Thought content normal.         Judgment: Judgment normal.  Assessment & Plan        1. Wellness examination (Primary)    - CBC without Differential; Future  - Comprehensive Metabolic Panel; Future  - Hemoglobin A1C; Future  - Lipid Panel; Future  - Thyroid  Stimulating Hormone (TSH) with Reflex to Free T4; Future    2. Anxiety and depression    - buPROPion  XL (WELLBUTRIN  XL) 300 MG 24 hr tablet; Take 1 tablet (300 mg) by mouth once daily  Dispense: 90 tablet; Refill: 1      Health Maintenance:   Recommend optimizing low carbohydrate diet efforts and obtaining at least 150 minutes of aerobic exercise per week. Recommend 20-25 grams of  dietary fiber daily. Recommend drinking at least 60-80 ounces of water per day.Immunizations UTD. Vision screening UTD. Dental Screening UTD.      Dariela Stoker H Narek Kniss, DO

## 2023-10-03 ENCOUNTER — Ambulatory Visit (INDEPENDENT_AMBULATORY_CARE_PROVIDER_SITE_OTHER): Payer: Self-pay | Admitting: Family Medicine

## 2023-10-03 NOTE — Progress Notes (Signed)
 Hello Donna Andrews    Your lab results have come back with no concerning findings. Have a great day!     Dr. Abran Abrahams

## 2023-10-26 ENCOUNTER — Other Ambulatory Visit (INDEPENDENT_AMBULATORY_CARE_PROVIDER_SITE_OTHER): Payer: Self-pay | Admitting: Family Medicine

## 2023-10-26 DIAGNOSIS — E782 Mixed hyperlipidemia: Secondary | ICD-10-CM

## 2023-10-26 DIAGNOSIS — Z6835 Body mass index (BMI) 35.0-35.9, adult: Secondary | ICD-10-CM

## 2023-12-23 ENCOUNTER — Other Ambulatory Visit (INDEPENDENT_AMBULATORY_CARE_PROVIDER_SITE_OTHER): Payer: Self-pay | Admitting: Family Medicine

## 2024-01-16 ENCOUNTER — Encounter (INDEPENDENT_AMBULATORY_CARE_PROVIDER_SITE_OTHER): Payer: Self-pay | Admitting: Family Medicine

## 2024-01-16 ENCOUNTER — Other Ambulatory Visit (INDEPENDENT_AMBULATORY_CARE_PROVIDER_SITE_OTHER): Payer: Self-pay | Admitting: Family Medicine

## 2024-01-16 DIAGNOSIS — E782 Mixed hyperlipidemia: Secondary | ICD-10-CM

## 2024-01-16 DIAGNOSIS — E66812 Obesity, class 2: Secondary | ICD-10-CM

## 2024-01-20 ENCOUNTER — Encounter (INDEPENDENT_AMBULATORY_CARE_PROVIDER_SITE_OTHER): Payer: Self-pay | Admitting: Family Medicine

## 2024-01-20 ENCOUNTER — Ambulatory Visit (INDEPENDENT_AMBULATORY_CARE_PROVIDER_SITE_OTHER): Admitting: Family Medicine

## 2024-01-20 DIAGNOSIS — E66812 Obesity, class 2: Secondary | ICD-10-CM

## 2024-01-20 DIAGNOSIS — Z6835 Body mass index (BMI) 35.0-35.9, adult: Secondary | ICD-10-CM

## 2024-01-20 DIAGNOSIS — E782 Mixed hyperlipidemia: Secondary | ICD-10-CM

## 2024-01-20 MED ORDER — SEMAGLUTIDE-WEIGHT MANAGEMENT 2.4 MG/0.75ML SC SOAJ: 2.4000 mg | SUBCUTANEOUS | 5 refills | Status: DC

## 2024-01-20 NOTE — Assessment & Plan Note (Signed)
Orders:    semaglutide [facility]) 2.4 MG/0.75ML injection; Inject 0.75 mLs (2.4 mg) into the skin once a week

## 2024-01-20 NOTE — Progress Notes (Signed)
 [facility]   OFFICE VISIT                HPI       63 yo :   Chief Complaint   Patient presents with    Medication Refill     wegovy       On Wegovy  2.4 mg weekly for 3 months.   Losing 1 lb/week.  No severe nausea  No vomiting.   No fatigue.      ROS   Review of Systems    Vital Signs   BP 119/82 (BP Site: Left arm, Patient Position: Sitting, Cuff Size: Large)   Pulse 69   Temp 97.6 F (36.4 C) (Temporal)   Ht 1.651 m (5' 5)   Wt 77.1 kg (170 lb)   SpO2 99%   BMI 28.29 kg/m   Physical Exam   Physical Exam  Constitutional:       Appearance: Normal appearance. She is normal weight.   Cardiovascular:      Rate and Rhythm: Normal rate and regular rhythm.   Neurological:      Mental Status: She is alert and oriented to person, place, and time.   Psychiatric:         Mood and Affect: Mood normal.         Behavior: Behavior normal.         Thought Content: Thought content normal.         Judgment: Judgment normal.        Assessment/Plan     Assessment & Plan  Mixed hyperlipidemia    Orders:    semaglutide  (Wegovy ) 2.4 MG/0.75ML injection; Inject 0.75 mLs (2.4 mg) into the skin once a week    Class 2 severe obesity due to excess calories with serious comorbidity and body mass index (BMI) of 35.0 to 35.9 in adult    Orders:    semaglutide  (Wegovy ) 2.4 MG/0.75ML injection; Inject 0.75 mLs (2.4 mg) into the skin once a week

## 2024-03-06 ENCOUNTER — Other Ambulatory Visit (INDEPENDENT_AMBULATORY_CARE_PROVIDER_SITE_OTHER): Payer: Self-pay | Admitting: Family Medicine

## 2024-03-18 ENCOUNTER — Other Ambulatory Visit (INDEPENDENT_AMBULATORY_CARE_PROVIDER_SITE_OTHER): Payer: Self-pay | Admitting: Family Medicine

## 2024-04-01 ENCOUNTER — Encounter (INDEPENDENT_AMBULATORY_CARE_PROVIDER_SITE_OTHER): Payer: Self-pay | Admitting: Family Medicine

## 2024-04-02 ENCOUNTER — Ambulatory Visit (INDEPENDENT_AMBULATORY_CARE_PROVIDER_SITE_OTHER): Admitting: Family Medicine

## 2024-04-02 ENCOUNTER — Encounter (INDEPENDENT_AMBULATORY_CARE_PROVIDER_SITE_OTHER): Payer: Self-pay | Admitting: Family Medicine

## 2024-04-16 ENCOUNTER — Other Ambulatory Visit (INDEPENDENT_AMBULATORY_CARE_PROVIDER_SITE_OTHER): Payer: Self-pay | Admitting: Family Medicine

## 2024-04-16 DIAGNOSIS — F32A Depression, unspecified: Secondary | ICD-10-CM

## 2024-05-04 ENCOUNTER — Ambulatory Visit (INDEPENDENT_AMBULATORY_CARE_PROVIDER_SITE_OTHER): Admitting: Family Medicine

## 2024-05-06 ENCOUNTER — Ambulatory Visit (INDEPENDENT_AMBULATORY_CARE_PROVIDER_SITE_OTHER): Admitting: Family Medicine

## 2024-05-21 ENCOUNTER — Other Ambulatory Visit (INDEPENDENT_AMBULATORY_CARE_PROVIDER_SITE_OTHER): Payer: Self-pay | Admitting: Family Medicine

## 2024-05-21 DIAGNOSIS — F32A Depression, unspecified: Secondary | ICD-10-CM

## 2024-05-22 ENCOUNTER — Encounter (INDEPENDENT_AMBULATORY_CARE_PROVIDER_SITE_OTHER): Payer: Self-pay | Admitting: Family Medicine

## 2024-05-22 ENCOUNTER — Ambulatory Visit (INDEPENDENT_AMBULATORY_CARE_PROVIDER_SITE_OTHER): Admitting: Family Medicine

## 2024-05-22 VITALS — BP 110/58 | HR 64 | Temp 98.0°F | Ht 65.0 in | Wt 162.6 lb

## 2024-05-22 DIAGNOSIS — Z853 Personal history of malignant neoplasm of breast: Secondary | ICD-10-CM

## 2024-05-22 DIAGNOSIS — E6609 Other obesity due to excess calories: Secondary | ICD-10-CM

## 2024-05-22 DIAGNOSIS — Z23 Encounter for immunization: Secondary | ICD-10-CM

## 2024-05-22 DIAGNOSIS — Z6827 Body mass index (BMI) 27.0-27.9, adult: Secondary | ICD-10-CM

## 2024-05-22 DIAGNOSIS — Z6835 Body mass index (BMI) 35.0-35.9, adult: Secondary | ICD-10-CM

## 2024-05-22 DIAGNOSIS — E7800 Pure hypercholesterolemia, unspecified: Secondary | ICD-10-CM

## 2024-05-22 DIAGNOSIS — F32A Depression, unspecified: Secondary | ICD-10-CM

## 2024-05-22 DIAGNOSIS — E782 Mixed hyperlipidemia: Secondary | ICD-10-CM

## 2024-05-22 DIAGNOSIS — E66811 Obesity, class 1: Secondary | ICD-10-CM

## 2024-05-22 DIAGNOSIS — F419 Anxiety disorder, unspecified: Secondary | ICD-10-CM

## 2024-05-22 MED ORDER — BUPROPION HCL ER (XL) 300 MG PO TB24: 300.0000 mg | ORAL_TABLET | Freq: Every day | ORAL | 1 refills | Status: AC

## 2024-05-22 MED ORDER — ATORVASTATIN CALCIUM 40 MG PO TABS: 40.0000 mg | ORAL_TABLET | Freq: Every day | ORAL | 3 refills | Status: AC

## 2024-05-22 MED ORDER — FAMOTIDINE 20 MG PO TABS: 20.0000 mg | ORAL_TABLET | Freq: Every evening | ORAL | 1 refills | Status: AC | PRN

## 2024-05-22 MED ORDER — SEMAGLUTIDE-WEIGHT MANAGEMENT 2.4 MG/0.75ML SC SOAJ: 2.4000 mg | SUBCUTANEOUS | 5 refills | Status: AC

## 2024-05-22 NOTE — Progress Notes (Signed)
 [facility]   OFFICE VISIT                HPI     Chief Complaint   Patient presents with    Weight Management     Pt would like to discuss their Wegovy  with Dr. Roetta.    Follow-up     Pt would like to discuss their Famotidine  and their cholesterol and see if there's anything that she needs to do.       HPI    63 yo female having lost 8 lbs on maintenance 2.4 mg weekly wegovy .   Exercises - resistance trains regularly   No fatigue  No n/v  No bad pain  No constipation.   Due for labs.   Rare gerd symptoms .   ROS   Review of Systems    Vital Signs   BP 110/58 (BP Site: Left arm, Patient Position: Sitting, Cuff Size: Large)   Pulse 64   Temp 98 F (36.7 C) (Temporal)   Ht 1.651 m (5' 5)   Wt 73.8 kg (162 lb 9.6 oz)   SpO2 100%   BMI 27.06 kg/m   Physical Exam   Physical Exam  Constitutional:       Appearance: Normal appearance. She is normal weight.   Neurological:      Mental Status: She is alert and oriented to person, place, and time.   Psychiatric:         Mood and Affect: Mood normal.         Behavior: Behavior normal.         Thought Content: Thought content normal.         Judgment: Judgment normal.        Assessment/Plan     Assessment & Plan  Need for vaccination    Orders:    Flu vaccine, TRIVALENT, 6 months and older (FLUARIX /FLULAVAL /FLUZONE ), single-dose PF, 0.5 mL    COVID-19 mRNA 2025-2026 vaccine 12 yrs and above (MODERNA/SPIKEVAX ) 50 mcg/0.5 mL    Class 1 obesity due to excess calories with serious comorbidity and body mass index (BMI) of 34.0 to 34.9 in adult         Personal history of malignant neoplasm of breast         Pure hypercholesterolemia    Orders:    atorvastatin  (LIPITOR) 40 MG tablet; Take 1 tablet (40 mg) by mouth once daily    Anxiety and depression    Orders:    buPROPion  XL (WELLBUTRIN  XL) 300 MG 24 hr tablet; Take 1 tablet (300 mg) by mouth once daily    Mixed hyperlipidemia    Orders:    semaglutide  (Wegovy ) 2.4 MG/0.75ML injection; Inject 0.75 mLs (2.4 mg)  into the skin once a week    Class 2 severe obesity due to excess calories with serious comorbidity and body mass index (BMI) of 35.0 to 35.9 in adult    Orders:    semaglutide  (Wegovy ) 2.4 MG/0.75ML injection; Inject 0.75 mLs (2.4 mg) into the skin once a week

## 2024-05-22 NOTE — Assessment & Plan Note (Signed)
Orders:    semaglutide [facility]) 2.4 MG/0.75ML injection; Inject 0.75 mLs (2.4 mg) into the skin once a week

## 2024-05-22 NOTE — Progress Notes (Signed)
 Have you seen any specialists/other providers since your last visit with us ?    Yes.  Dermatologist.    Health Maintenance Due   Topic Date Due    Cervical Cancer Screening  Never done    Advance Directive on File  Never done    Pneumonia Vaccine Age 64 Years and Older (1 of 2 - PCV) Never done    Influenza Vaccine  01/03/2024

## 2024-05-22 NOTE — Assessment & Plan Note (Signed)
 Orders:    atorvastatin  (LIPITOR) 40 MG tablet; Take 1 tablet (40 mg) by mouth once daily

## 2024-05-26 ENCOUNTER — Other Ambulatory Visit (FREE_STANDING_LABORATORY_FACILITY)

## 2024-05-26 DIAGNOSIS — E66812 Obesity, class 2: Secondary | ICD-10-CM

## 2024-05-26 DIAGNOSIS — Z6835 Body mass index (BMI) 35.0-35.9, adult: Secondary | ICD-10-CM

## 2024-05-26 DIAGNOSIS — E7800 Pure hypercholesterolemia, unspecified: Secondary | ICD-10-CM

## 2024-05-26 DIAGNOSIS — E782 Mixed hyperlipidemia: Secondary | ICD-10-CM

## 2024-05-26 LAB — COMPREHENSIVE METABOLIC PANEL
ALT: 29 U/L (ref <=55)
AST (SGOT): 30 U/L (ref <=41)
Albumin/Globulin Ratio: 1.6 (ref 0.9–2.2)
Albumin: 4.2 g/dL (ref 3.5–4.9)
Alkaline Phosphatase: 64 U/L (ref 37–117)
Anion Gap: 6 (ref 5.0–15.0)
BUN: 12 mg/dL (ref 7–21)
Bilirubin, Total: 0.6 mg/dL (ref 0.2–1.2)
CO2: 28 meq/L (ref 17–29)
Calcium: 9.6 mg/dL (ref 8.5–10.5)
Chloride: 104 meq/L (ref 99–111)
Creatinine: 0.8 mg/dL (ref 0.4–1.0)
GFR: >60 mL/min/1.73 m2 (ref >=60.0)
Globulin: 2.7 g/dL (ref 2.0–3.6)
Glucose: 107 mg/dL — ABNORMAL HIGH (ref 70–100)
Hemolysis Index: 9 {index}
Potassium: 4.7 meq/L (ref 3.5–5.3)
Protein, Total: 6.9 g/dL (ref 6.0–8.3)
Sodium: 138 meq/L (ref 135–145)

## 2024-05-26 LAB — LIPID PANEL
Cholesterol / HDL Ratio: 2.5 {index}
Cholesterol: 204 mg/dL — ABNORMAL HIGH (ref <=199)
HDL: 82 mg/dL (ref >=40)
LDL Calculated: 110 mg/dL — ABNORMAL HIGH (ref 0–99)
Non-HDL Cholesterol: 122 mg/dL (ref <130.0)
Triglycerides: 69 mg/dL (ref 34–149)
VLDL Calculated: 11 mg/dL (ref 10–40)

## 2024-05-26 LAB — HEMOGLOBIN A1C
Average Estimated Glucose: 102.5 mg/dL
Hemoglobin A1C: 5.2 % (ref 4.6–5.6)

## 2024-05-27 ENCOUNTER — Ambulatory Visit (INDEPENDENT_AMBULATORY_CARE_PROVIDER_SITE_OTHER): Payer: Self-pay | Admitting: Family Medicine

## 2024-05-27 NOTE — Progress Notes (Signed)
 Hello Donna Andrews    Your lab results have come back with no concerning findings. Have a great day!     Dr. Roetta

## 2024-06-08 ENCOUNTER — Other Ambulatory Visit (INDEPENDENT_AMBULATORY_CARE_PROVIDER_SITE_OTHER): Payer: Self-pay | Admitting: Family Medicine

## 2024-06-08 ENCOUNTER — Encounter (INDEPENDENT_AMBULATORY_CARE_PROVIDER_SITE_OTHER): Payer: Self-pay | Admitting: Family Medicine

## 2024-07-09 ENCOUNTER — Ambulatory Visit: Admission: RE | Admit: 2024-07-09 | Source: Ambulatory Visit

## 2024-07-14 ENCOUNTER — Ambulatory Visit
